# Patient Record
Sex: Female | Born: 1970
Health system: Southern US, Community
[De-identification: ages and names within clinical notes are randomized; demographics above are authoritative.]

## PROBLEM LIST (undated history)

## (undated) DIAGNOSIS — T7840XA Allergy, unspecified, initial encounter: Secondary | ICD-10-CM

## (undated) DIAGNOSIS — R32 Unspecified urinary incontinence: Secondary | ICD-10-CM

## (undated) DIAGNOSIS — G43909 Migraine, unspecified, not intractable, without status migrainosus: Secondary | ICD-10-CM

## (undated) DIAGNOSIS — F419 Anxiety disorder, unspecified: Secondary | ICD-10-CM

## (undated) DIAGNOSIS — F329 Major depressive disorder, single episode, unspecified: Secondary | ICD-10-CM

## (undated) DIAGNOSIS — J302 Other seasonal allergic rhinitis: Secondary | ICD-10-CM

## (undated) DIAGNOSIS — Z8619 Personal history of other infectious and parasitic diseases: Secondary | ICD-10-CM

## (undated) DIAGNOSIS — Z9109 Other allergy status, other than to drugs and biological substances: Secondary | ICD-10-CM

## (undated) DIAGNOSIS — K635 Polyp of colon: Secondary | ICD-10-CM

## (undated) DIAGNOSIS — F32A Depression, unspecified: Secondary | ICD-10-CM

## (undated) DIAGNOSIS — K219 Gastro-esophageal reflux disease without esophagitis: Secondary | ICD-10-CM

## (undated) DIAGNOSIS — F4310 Post-traumatic stress disorder, unspecified: Secondary | ICD-10-CM

## (undated) HISTORY — DX: Allergy, unspecified, initial encounter: T78.40XA

## (undated) HISTORY — DX: Depression, unspecified: F32.A

## (undated) HISTORY — PX: POLYPECTOMY: SHX149

## (undated) HISTORY — DX: Major depressive disorder, single episode, unspecified: F32.9

## (undated) HISTORY — PX: WISDOM TOOTH EXTRACTION: SHX21

## (undated) HISTORY — DX: Migraine, unspecified, not intractable, without status migrainosus: G43.909

## (undated) HISTORY — PX: TUBAL LIGATION: SHX77

## (undated) HISTORY — PX: COLONOSCOPY: SHX174

## (undated) HISTORY — DX: Other allergy status, other than to drugs and biological substances: Z91.09

## (undated) HISTORY — DX: Gastro-esophageal reflux disease without esophagitis: K21.9

## (undated) HISTORY — DX: Post-traumatic stress disorder, unspecified: F43.10

## (undated) HISTORY — PX: ESOPHAGOGASTRODUODENOSCOPY ENDOSCOPY: SHX5814

## (undated) HISTORY — DX: Personal history of other infectious and parasitic diseases: Z86.19

## (undated) HISTORY — PX: TONSILLECTOMY: SUR1361

## (undated) HISTORY — DX: Other seasonal allergic rhinitis: J30.2

## (undated) HISTORY — PX: ENDOMETRIAL ABLATION: SHX621

## (undated) HISTORY — DX: Unspecified urinary incontinence: R32

## (undated) HISTORY — DX: Anxiety disorder, unspecified: F41.9

## (undated) HISTORY — DX: Polyp of colon: K63.5

---

## 2006-01-05 HISTORY — PX: CHOLECYSTECTOMY: SHX55

## 2012-01-06 DIAGNOSIS — K635 Polyp of colon: Secondary | ICD-10-CM

## 2012-01-06 HISTORY — DX: Polyp of colon: K63.5

## 2015-08-20 ENCOUNTER — Encounter: Payer: Self-pay | Admitting: *Deleted

## 2015-08-20 ENCOUNTER — Telehealth: Payer: Self-pay | Admitting: *Deleted

## 2015-08-20 NOTE — Telephone Encounter (Signed)
Pre-Visit Call completed with patient and chart updated.   Pre-Visit Info documented in Specialty Comments under [employer].    

## 2015-08-20 NOTE — Telephone Encounter (Signed)
Unable to reach patient at time of Pre-Visit Call.  Left message for patient to return call when available.    

## 2015-08-21 ENCOUNTER — Encounter: Payer: Self-pay | Admitting: Physician Assistant

## 2015-08-21 ENCOUNTER — Ambulatory Visit (INDEPENDENT_AMBULATORY_CARE_PROVIDER_SITE_OTHER): Payer: BLUE CROSS/BLUE SHIELD | Admitting: Physician Assistant

## 2015-08-21 VITALS — BP 116/72 | HR 74 | Temp 99.0°F | Resp 16 | Ht 66.5 in | Wt 165.2 lb

## 2015-08-21 DIAGNOSIS — E559 Vitamin D deficiency, unspecified: Secondary | ICD-10-CM

## 2015-08-21 DIAGNOSIS — F411 Generalized anxiety disorder: Secondary | ICD-10-CM | POA: Diagnosis not present

## 2015-08-21 DIAGNOSIS — R221 Localized swelling, mass and lump, neck: Secondary | ICD-10-CM | POA: Diagnosis not present

## 2015-08-21 LAB — CBC WITH DIFFERENTIAL/PLATELET
BASOS ABS: 0 10*3/uL (ref 0.0–0.1)
Basophils Relative: 0.5 % (ref 0.0–3.0)
EOS PCT: 1.1 % (ref 0.0–5.0)
Eosinophils Absolute: 0.1 10*3/uL (ref 0.0–0.7)
HEMATOCRIT: 39.2 % (ref 36.0–46.0)
Hemoglobin: 13.3 g/dL (ref 12.0–15.0)
LYMPHS ABS: 2.8 10*3/uL (ref 0.7–4.0)
LYMPHS PCT: 37.5 % (ref 12.0–46.0)
MCHC: 34 g/dL (ref 30.0–36.0)
MCV: 87.2 fl (ref 78.0–100.0)
MONOS PCT: 9.7 % (ref 3.0–12.0)
Monocytes Absolute: 0.7 10*3/uL (ref 0.1–1.0)
NEUTROS ABS: 3.8 10*3/uL (ref 1.4–7.7)
NEUTROS PCT: 51.2 % (ref 43.0–77.0)
PLATELETS: 350 10*3/uL (ref 150.0–400.0)
RBC: 4.49 Mil/uL (ref 3.87–5.11)
RDW: 12.7 % (ref 11.5–15.5)
WBC: 7.5 10*3/uL (ref 4.0–10.5)

## 2015-08-21 MED ORDER — ALPRAZOLAM 0.5 MG PO TABS: 0.5000 mg | ORAL_TABLET | Freq: Every evening | ORAL | 1 refills | Status: DC | PRN

## 2015-08-21 NOTE — Patient Instructions (Signed)
Please go to the lab for blood work. I will call with your results. We will alter regimen based on results.  Please schedule an appointment for a complete physical.

## 2015-08-21 NOTE — Progress Notes (Signed)
Patient presents to clinic today to establish care.  Acute Concerns: Patient endorses a small lump of the left side of anterior neck over the pat couple of weeks. Notes lesion is mobile. Is not tender. Denies noting similar nodules elsewhere. Does note increase in allergy symptoms. Denies fever, chills, fatigue.   Chronic Issues: Patient with history of vitamin d deficiency, previously prescribed Ergocalciferol 50,000 units once weekly for several months. Is currently taking OTC supplement. Would like levels checked today.  Patient also with history of anxiety and depression coupled with PTSD. Sees a counselor every few weeks. Has been doing very well. Has Xanax Rx for rare use for panic attack or insomnia. Denies SI/HI.   Past Medical History:  Diagnosis Date  . Anxiety and depression    Hx of  . Colon polyps 2014   Benign  . Environmental allergies   . GERD (gastroesophageal reflux disease)   . History of chicken pox   . Migraines   . PTSD (post-traumatic stress disorder)   . Seasonal allergies   . Urine incontinence    Vaginal sling approx 12 yrs ago    Past Surgical History:  Procedure Laterality Date  . CHOLECYSTECTOMY  2008  . COLONOSCOPY    . ENDOMETRIAL ABLATION  ~2011   [facility]  . ESOPHAGOGASTRODUODENOSCOPY ENDOSCOPY    . TONSILLECTOMY  20 years ago  . TUBAL LIGATION    . WISDOM TOOTH EXTRACTION      Current Outpatient Prescriptions on File Prior to Visit  Medication Sig Dispense Refill  . b complex vitamins tablet Take 1 tablet by mouth as needed.    . Cholecalciferol (VITAMIN D PO) Take 5,000 Units by mouth 2 (two) times daily.     . cyclobenzaprine (FLEXERIL) 10 MG tablet Take 10 mg by mouth as needed for muscle spasms.    Marland Kitchen OVER THE COUNTER MEDICATION     . OVER THE COUNTER MEDICATION Digestive Aid    . Probiotic Product (PROBIOTIC DAILY PO) Take 1 capsule by mouth daily.     No current facility-administered medications on file prior to visit.      Allergies  Allergen Reactions  . Gluten Meal Nausea And Vomiting  . Kelp [Iodine] Rash  . Levofloxacin Rash  . Sulfamethoxazole-Trimethoprim Rash    Family History  Problem Relation Age of Onset  . Adopted: Yes  . Mental illness Mother     Manic Depression  . Alcoholism Father   . Stroke Sister     #1 80  . Clotting disorder Sister     #1  . Celiac disease Sister     74  . Raynaud syndrome Sister     #1  . Colitis Sister     Half  . Healthy Brother     Half  . Stroke Brother     Half  . Ovarian cancer Maternal Aunt   . Cervical cancer Maternal Aunt   . Learning disabilities Son     #1  . Autism Neg Hx     #2  . Anxiety disorder Neg Hx     #3    Social History   Social History  . Marital status: Divorced    Spouse name: N/A  . Number of children: N/A  . Years of education: N/A   Occupational History  . Not on file.   Social History Main Topics  . Smoking status: Never Smoker  . Smokeless tobacco: Never Used  . Alcohol use Yes  Comment: rare  . Drug use: No  . Sexual activity: Not on file   Other Topics Concern  . Not on file   Social History Narrative  . No narrative on file   Review of Systems  Constitutional: Negative for fever and weight loss.  HENT: Negative for ear discharge, ear pain, hearing loss and tinnitus.   Eyes: Negative for blurred vision, double vision, photophobia and pain.  Respiratory: Negative for cough and shortness of breath.   Cardiovascular: Negative for chest pain and palpitations.  Gastrointestinal: Negative for abdominal pain, blood in stool, constipation, diarrhea, heartburn, melena, nausea and vomiting.  Genitourinary: Negative for dysuria, flank pain, frequency, hematuria and urgency.  Musculoskeletal: Negative for falls.  Neurological: Negative for dizziness, loss of consciousness and headaches.  Endo/Heme/Allergies: Negative for environmental allergies.  Psychiatric/Behavioral: Positive for  depression. Negative for hallucinations, substance abuse and suicidal ideas. The patient is nervous/anxious. The patient does not have insomnia.     BP 116/72 (BP Location: Right Arm, Patient Position: Sitting, Cuff Size: Normal)   Pulse 74   Temp 99 F (37.2 C) (Oral)   Resp 16   Ht 5' 6.5" (1.689 m)   Wt 165 lb 4 oz (75 kg)   LMP 08/19/2015 Comment: perimenopausal  SpO2 98%   BMI 26.27 kg/m   Physical Exam  Constitutional: She is oriented to person, place, and time and well-developed, well-nourished, and in no distress.  HENT:  Head: Normocephalic and atraumatic.  Nose: Mucosal edema and rhinorrhea present. Right sinus exhibits no maxillary sinus tenderness and no frontal sinus tenderness. Left sinus exhibits no maxillary sinus tenderness and no frontal sinus tenderness.  Eyes: Conjunctivae are normal.  Neck: Neck supple.    Cardiovascular: Normal rate, regular rhythm, normal heart sounds and intact distal pulses.   Pulmonary/Chest: Effort normal and breath sounds normal. No respiratory distress. She has no wheezes. She has no rales. She exhibits no tenderness.  Neurological: She is alert and oriented to person, place, and time.  Skin: Skin is warm and dry. No rash noted.  Psychiatric: Affect normal.  Vitals reviewed.   Recent Results (from the past 2160 hour(s))  CBC w/Diff     Status: None   Collection Time: 08/21/15  2:38 PM  Result Value Ref Range   WBC 7.5 4.0 - 10.5 K/uL   RBC 4.49 3.87 - 5.11 Mil/uL   Hemoglobin 13.3 12.0 - 15.0 g/dL   HCT 39.2 36.0 - 46.0 %   MCV 87.2 78.0 - 100.0 fl   MCHC 34.0 30.0 - 36.0 g/dL   RDW 12.7 11.5 - 15.5 %   Platelets 350.0 150.0 - 400.0 K/uL   Neutrophils Relative % 51.2 43.0 - 77.0 %   Lymphocytes Relative 37.5 12.0 - 46.0 %   Monocytes Relative 9.7 3.0 - 12.0 %   Eosinophils Relative 1.1 0.0 - 5.0 %   Basophils Relative 0.5 0.0 - 3.0 %   Neutro Abs 3.8 1.4 - 7.7 K/uL   Lymphs Abs 2.8 0.7 - 4.0 K/uL   Monocytes Absolute 0.7  0.1 - 1.0 K/uL   Eosinophils Absolute 0.1 0.0 - 0.7 K/uL   Basophils Absolute 0.0 0.0 - 0.1 K/uL    Assessment/Plan: Hypovitaminosis D Will repeat Vitamin D level today.  Neck mass Consistent with palpable lymph node, suspect secondary to allergy. No sign of infection on examination. No other lymphadenopathy palpable. Will check CBC today. Discussed treatment for allergies. If not resolving over the next couple of weeks or if  CBC abnormal will obtain imaging.  Anxiety state Doing well overall. Continue counseling. Rx Xanax refilled.    Leeanne Rio, PA-C

## 2015-08-23 DIAGNOSIS — F411 Generalized anxiety disorder: Secondary | ICD-10-CM | POA: Insufficient documentation

## 2015-08-23 DIAGNOSIS — R221 Localized swelling, mass and lump, neck: Secondary | ICD-10-CM | POA: Insufficient documentation

## 2015-08-23 DIAGNOSIS — E559 Vitamin D deficiency, unspecified: Secondary | ICD-10-CM | POA: Insufficient documentation

## 2015-08-23 NOTE — Assessment & Plan Note (Signed)
Consistent with palpable lymph node, suspect secondary to allergy. No sign of infection on examination. No other lymphadenopathy palpable. Will check CBC today. Discussed treatment for allergies. If not resolving over the next couple of weeks or if CBC abnormal will obtain imaging.

## 2015-08-23 NOTE — Assessment & Plan Note (Signed)
Will repeat Vitamin D level today.

## 2015-08-23 NOTE — Assessment & Plan Note (Signed)
Doing well overall. Continue counseling. Rx Xanax refilled.

## 2015-08-25 LAB — VITAMIN D 1,25 DIHYDROXY
VITAMIN D 1, 25 (OH) TOTAL: 35 pg/mL (ref 18–72)
VITAMIN D3 1, 25 (OH): 35 pg/mL
Vitamin D2 1, 25 (OH)2: <8 pg/mL

## 2015-09-02 ENCOUNTER — Telehealth: Payer: Self-pay | Admitting: Physician Assistant

## 2015-09-02 MED ORDER — RIZATRIPTAN BENZOATE 10 MG PO TABS: 10.0000 mg | ORAL_TABLET | Freq: Once | ORAL | 0 refills | Status: DC

## 2015-09-02 NOTE — Telephone Encounter (Signed)
I have sent a refill to the patient's pharmacy.

## 2015-09-02 NOTE — Telephone Encounter (Signed)
Pt says that she use to take Maxalt. Pt would like to know if her provider would prescribe a Rx for medication as she needs it again. Pt says that she has a history of having migraines.    Please advise.  Pharmacy: [place], [place], [facility]

## 2015-09-04 ENCOUNTER — Encounter: Payer: Self-pay | Admitting: Physician Assistant

## 2015-09-04 ENCOUNTER — Ambulatory Visit (INDEPENDENT_AMBULATORY_CARE_PROVIDER_SITE_OTHER): Payer: BLUE CROSS/BLUE SHIELD | Admitting: Physician Assistant

## 2015-09-04 VITALS — BP 120/65 | HR 74 | Temp 98.0°F | Wt 166.0 lb

## 2015-09-04 DIAGNOSIS — Z0000 Encounter for general adult medical examination without abnormal findings: Secondary | ICD-10-CM | POA: Diagnosis not present

## 2015-09-04 NOTE — Assessment & Plan Note (Signed)
Depression screen negative. Health Maintenance reviewed -- Declines flu shot. Will go downstairs to schedule screening mammogram. PAP up-to-date. Followed by GYN. Preventive schedule discussed and handout given in AVS. Will obtain fasting labs today.

## 2015-09-04 NOTE — Addendum Note (Signed)
Addended by: Caffie Pinto on: 09/04/2015 05:08 PM   Modules accepted: Orders

## 2015-09-04 NOTE — Patient Instructions (Signed)
Please go to the lab for blood work.   Our office will call you with your results unless you have chosen to receive results via MyChart.  If your blood work is normal we will follow-up each year for physicals and as scheduled for chronic medical problems.  If anything is abnormal we will treat accordingly and get you in for a follow-up.  Continue medications as directed.  Preventive Care for Adults, Female A healthy lifestyle and preventive care can promote health and wellness. Preventive health guidelines for women include the following key practices.  A routine yearly physical is a good way to check with your health care provider about your health and preventive screening. It is a chance to share any concerns and updates on your health and to receive a thorough exam.  Visit your dentist for a routine exam and preventive care every 6 months. Brush your teeth twice a day and floss once a day. Good oral hygiene prevents tooth decay and gum disease.  The frequency of eye exams is based on your age, health, family medical history, use of contact lenses, and other factors. Follow your health care provider's recommendations for frequency of eye exams.  Eat a healthy diet. Foods like vegetables, fruits, whole grains, low-fat dairy products, and lean protein foods contain the nutrients you need without too many calories. Decrease your intake of foods high in solid fats, added sugars, and salt. Eat the right amount of calories for you.Get information about a proper diet from your health care provider, if necessary.  Regular physical exercise is one of the most important things you can do for your health. Most adults should get at least 150 minutes of moderate-intensity exercise (any activity that increases your heart rate and causes you to sweat) each week. In addition, most adults need muscle-strengthening exercises on 2 or more days a week.  Maintain a healthy weight. The body mass index (BMI) is a  screening tool to identify possible weight problems. It provides an estimate of body fat based on height and weight. Your health care provider can find your BMI and can help you achieve or maintain a healthy weight.For adults 20 years and older:  A BMI below 18.5 is considered underweight.  A BMI of 18.5 to 24.9 is normal.  A BMI of 25 to 29.9 is considered overweight.  A BMI of 30 and above is considered obese.  Maintain normal blood lipids and cholesterol levels by exercising and minimizing your intake of saturated fat. Eat a balanced diet with plenty of fruit and vegetables. Blood tests for lipids and cholesterol should begin at age 3 and be repeated every 5 years. If your lipid or cholesterol levels are high, you are over 50, or you are at high risk for heart disease, you may need your cholesterol levels checked more frequently.Ongoing high lipid and cholesterol levels should be treated with medicines if diet and exercise are not working.  If you smoke, find out from your health care provider how to quit. If you do not use tobacco, do not start.  Lung cancer screening is recommended for adults aged 24-80 years who are at high risk for developing lung cancer because of a history of smoking. A yearly low-dose CT scan of the lungs is recommended for people who have at least a 30-pack-year history of smoking and are a current smoker or have quit within the past 15 years. A pack year of smoking is smoking an average of 1 pack of cigarettes  a day for 1 year (for example: 1 pack a day for 30 years or 2 packs a day for 15 years). Yearly screening should continue until the smoker has stopped smoking for at least 15 years. Yearly screening should be stopped for people who develop a health problem that would prevent them from having lung cancer treatment.  If you are pregnant, do not drink alcohol. If you are breastfeeding, be very cautious about drinking alcohol. If you are not pregnant and choose to  drink alcohol, do not have more than 1 drink per day. One drink is considered to be 12 ounces (355 mL) of beer, 5 ounces (148 mL) of wine, or 1.5 ounces (44 mL) of liquor.  Avoid use of street drugs. Do not share needles with anyone. Ask for help if you need support or instructions about stopping the use of drugs.  High blood pressure causes heart disease and increases the risk of stroke. Your blood pressure should be checked at least every 1 to 2 years. Ongoing high blood pressure should be treated with medicines if weight loss and exercise do not work.  If you are 42-48 years old, ask your health care provider if you should take aspirin to prevent strokes.  Diabetes screening is done by taking a blood sample to check your blood glucose level after you have not eaten for a certain period of time (fasting). If you are not overweight and you do not have risk factors for diabetes, you should be screened once every 3 years starting at age 57. If you are overweight or obese and you are 27-72 years of age, you should be screened for diabetes every year as part of your cardiovascular risk assessment.  Breast cancer screening is essential preventive care for women. You should practice "breast self-awareness." This means understanding the normal appearance and feel of your breasts and may include breast self-examination. Any changes detected, no matter how small, should be reported to a health care provider. Women in their 68s and 30s should have a clinical breast exam (CBE) by a health care provider as part of a regular health exam every 1 to 3 years. After age 32, women should have a CBE every year. Starting at age 23, women should consider having a mammogram (breast X-ray test) every year. Women who have a family history of breast cancer should talk to their health care provider about genetic screening. Women at a high risk of breast cancer should talk to their health care providers about having an MRI and a  mammogram every year.  Breast cancer gene (BRCA)-related cancer risk assessment is recommended for women who have family members with BRCA-related cancers. BRCA-related cancers include breast, ovarian, tubal, and peritoneal cancers. Having family members with these cancers may be associated with an increased risk for harmful changes (mutations) in the breast cancer genes BRCA1 and BRCA2. Results of the assessment will determine the need for genetic counseling and BRCA1 and BRCA2 testing.  Your health care provider may recommend that you be screened regularly for cancer of the pelvic organs (ovaries, uterus, and vagina). This screening involves a pelvic examination, including checking for microscopic changes to the surface of your cervix (Pap test). You may be encouraged to have this screening done every 3 years, beginning at age 59.  For women ages 67-65, health care providers may recommend pelvic exams and Pap testing every 3 years, or they may recommend the Pap and pelvic exam, combined with testing for human papilloma virus (HPV),  every 5 years. Some types of HPV increase your risk of cervical cancer. Testing for HPV may also be done on women of any age with unclear Pap test results.  Other health care providers may not recommend any screening for nonpregnant women who are considered low risk for pelvic cancer and who do not have symptoms. Ask your health care provider if a screening pelvic exam is right for you.  If you have had past treatment for cervical cancer or a condition that could lead to cancer, you need Pap tests and screening for cancer for at least 20 years after your treatment. If Pap tests have been discontinued, your risk factors (such as having a new sexual partner) need to be reassessed to determine if screening should resume. Some women have medical problems that increase the chance of getting cervical cancer. In these cases, your health care provider may recommend more frequent  screening and Pap tests.  Colorectal cancer can be detected and often prevented. Most routine colorectal cancer screening begins at the age of 6 years and continues through age 24 years. However, your health care provider may recommend screening at an earlier age if you have risk factors for colon cancer. On a yearly basis, your health care provider may provide home test kits to check for hidden blood in the stool. Use of a small camera at the end of a tube, to directly examine the colon (sigmoidoscopy or colonoscopy), can detect the earliest forms of colorectal cancer. Talk to your health care provider about this at age 63, when routine screening begins. Direct exam of the colon should be repeated every 5-10 years through age 38 years, unless early forms of precancerous polyps or small growths are found.  People who are at an increased risk for hepatitis B should be screened for this virus. You are considered at high risk for hepatitis B if:  You were born in a country where hepatitis B occurs often. Talk with your health care provider about which countries are considered high risk.  Your parents were born in a high-risk country and you have not received a shot to protect against hepatitis B (hepatitis B vaccine).  You have HIV or AIDS.  You use needles to inject street drugs.  You live with, or have sex with, someone who has hepatitis B.  You get hemodialysis treatment.  You take certain medicines for conditions like cancer, organ transplantation, and autoimmune conditions.  Hepatitis C blood testing is recommended for all people born from [birth year] through 1965 and any individual with known risks for hepatitis C.  Practice safe sex. Use condoms and avoid high-risk sexual practices to reduce the spread of sexually transmitted infections (STIs). STIs include gonorrhea, chlamydia, syphilis, trichomonas, herpes, HPV, and human immunodeficiency virus (HIV). Herpes, HIV, and HPV are viral illnesses  that have no cure. They can result in disability, cancer, and death.  You should be screened for sexually transmitted illnesses (STIs) including gonorrhea and chlamydia if:  You are sexually active and are younger than 24 years.  You are older than 24 years and your health care provider tells you that you are at risk for this type of infection.  Your sexual activity has changed since you were last screened and you are at an increased risk for chlamydia or gonorrhea. Ask your health care provider if you are at risk.  If you are at risk of being infected with HIV, it is recommended that you take a prescription medicine daily to prevent  HIV infection. This is called preexposure prophylaxis (PrEP). You are considered at risk if:  You are sexually active and do not regularly use condoms or know the HIV status of your partner(s).  You take drugs by injection.  You are sexually active with a partner who has HIV.  Talk with your health care provider about whether you are at high risk of being infected with HIV. If you choose to begin PrEP, you should first be tested for HIV. You should then be tested every 3 months for as long as you are taking PrEP.  Osteoporosis is a disease in which the bones lose minerals and strength with aging. This can result in serious bone fractures or breaks. The risk of osteoporosis can be identified using a bone density scan. Women ages 38 years and over and women at risk for fractures or osteoporosis should discuss screening with their health care providers. Ask your health care provider whether you should take a calcium supplement or vitamin D to reduce the rate of osteoporosis.  Menopause can be associated with physical symptoms and risks. Hormone replacement therapy is available to decrease symptoms and risks. You should talk to your health care provider about whether hormone replacement therapy is right for you.  Use sunscreen. Apply sunscreen liberally and  repeatedly throughout the day. You should seek shade when your shadow is shorter than you. Protect yourself by wearing long sleeves, pants, a wide-brimmed hat, and sunglasses year round, whenever you are outdoors.  Once a month, do a whole body skin exam, using a mirror to look at the skin on your back. Tell your health care provider of new moles, moles that have irregular borders, moles that are larger than a pencil eraser, or moles that have changed in shape or color.  Stay current with required vaccines (immunizations).  Influenza vaccine. All adults should be immunized every year.  Tetanus, diphtheria, and acellular pertussis (Td, Tdap) vaccine. Pregnant women should receive 1 dose of Tdap vaccine during each pregnancy. The dose should be obtained regardless of the length of time since the last dose. Immunization is preferred during the 27th-36th week of gestation. An adult who has not previously received Tdap or who does not know her vaccine status should receive 1 dose of Tdap. This initial dose should be followed by tetanus and diphtheria toxoids (Td) booster doses every 10 years. Adults with an unknown or incomplete history of completing a 3-dose immunization series with Td-containing vaccines should begin or complete a primary immunization series including a Tdap dose. Adults should receive a Td booster every 10 years.  Varicella vaccine. An adult without evidence of immunity to varicella should receive 2 doses or a second dose if she has previously received 1 dose. Pregnant females who do not have evidence of immunity should receive the first dose after pregnancy. This first dose should be obtained before leaving the health care facility. The second dose should be obtained 4-8 weeks after the first dose.  Human papillomavirus (HPV) vaccine. Females aged 13-26 years who have not received the vaccine previously should obtain the 3-dose series. The vaccine is not recommended for use in pregnant  females. However, pregnancy testing is not needed before receiving a dose. If a female is found to be pregnant after receiving a dose, no treatment is needed. In that case, the remaining doses should be delayed until after the pregnancy. Immunization is recommended for any person with an immunocompromised condition through the age of 15 years if she did  not get any or all doses earlier. During the 3-dose series, the second dose should be obtained 4-8 weeks after the first dose. The third dose should be obtained 24 weeks after the first dose and 16 weeks after the second dose.  Zoster vaccine. One dose is recommended for adults aged 87 years or older unless certain conditions are present.  Measles, mumps, and rubella (MMR) vaccine. Adults born before [birth year] generally are considered immune to measles and mumps. Adults born in [birth year] or later should have 1 or more doses of MMR vaccine unless there is a contraindication to the vaccine or there is laboratory evidence of immunity to each of the three diseases. A routine second dose of MMR vaccine should be obtained at least 28 days after the first dose for students attending postsecondary schools, health care workers, or international travelers. People who received inactivated measles vaccine or an unknown type of measles vaccine during 1963-1967 should receive 2 doses of MMR vaccine. People who received inactivated mumps vaccine or an unknown type of mumps vaccine before 1979 and are at high risk for mumps infection should consider immunization with 2 doses of MMR vaccine. For females of childbearing age, rubella immunity should be determined. If there is no evidence of immunity, females who are not pregnant should be vaccinated. If there is no evidence of immunity, females who are pregnant should delay immunization until after pregnancy. Unvaccinated health care workers born before [birth year] who lack laboratory evidence of measles, mumps, or rubella immunity or laboratory  confirmation of disease should consider measles and mumps immunization with 2 doses of MMR vaccine or rubella immunization with 1 dose of MMR vaccine.  Pneumococcal 13-valent conjugate (PCV13) vaccine. When indicated, a person who is uncertain of his immunization history and has no record of immunization should receive the PCV13 vaccine. All adults 52 years of age and older should receive this vaccine. An adult aged 23 years or older who has certain medical conditions and has not been previously immunized should receive 1 dose of PCV13 vaccine. This PCV13 should be followed with a dose of pneumococcal polysaccharide (PPSV23) vaccine. Adults who are at high risk for pneumococcal disease should obtain the PPSV23 vaccine at least 8 weeks after the dose of PCV13 vaccine. Adults older than 45 years of age who have normal immune system function should obtain the PPSV23 vaccine dose at least 1 year after the dose of PCV13 vaccine.  Pneumococcal polysaccharide (PPSV23) vaccine. When PCV13 is also indicated, PCV13 should be obtained first. All adults aged 55 years and older should be immunized. An adult younger than age 54 years who has certain medical conditions should be immunized. Any person who resides in a nursing home or long-term care facility should be immunized. An adult smoker should be immunized. People with an immunocompromised condition and certain other conditions should receive both PCV13 and PPSV23 vaccines. People with human immunodeficiency virus (HIV) infection should be immunized as soon as possible after diagnosis. Immunization during chemotherapy or radiation therapy should be avoided. Routine use of PPSV23 vaccine is not recommended for American Indians, [place] Natives, or people younger than 65 years unless there are medical conditions that require PPSV23 vaccine. When indicated, people who have unknown immunization and have no record of immunization should receive PPSV23 vaccine. One-time  revaccination 5 years after the first dose of PPSV23 is recommended for people aged 19-64 years who have chronic kidney failure, nephrotic syndrome, asplenia, or immunocompromised conditions. People who received 1-2 doses of PPSV23 before  age 37 years should receive another dose of PPSV23 vaccine at age 55 years or later if at least 5 years have passed since the previous dose. Doses of PPSV23 are not needed for people immunized with PPSV23 at or after age 61 years.  Meningococcal vaccine. Adults with asplenia or persistent complement component deficiencies should receive 2 doses of quadrivalent meningococcal conjugate (MenACWY-D) vaccine. The doses should be obtained at least 2 months apart. Microbiologists working with certain meningococcal bacteria, [employer] recruits, people at risk during an outbreak, and people who travel to or live in countries with a high rate of meningitis should be immunized. A first-year college student up through age 40 years who is living in a residence hall should receive a dose if she did not receive a dose on or after her 16th birthday. Adults who have certain high-risk conditions should receive one or more doses of vaccine.  Hepatitis A vaccine. Adults who wish to be protected from this disease, have certain high-risk conditions, work with hepatitis A-infected animals, work in hepatitis A research labs, or travel to or work in countries with a high rate of hepatitis A should be immunized. Adults who were previously unvaccinated and who anticipate close contact with an international adoptee during the first 60 days after arrival in the [place] from a country with a high rate of hepatitis A should be immunized.  Hepatitis B vaccine. Adults who wish to be protected from this disease, have certain high-risk conditions, may be exposed to blood or other infectious body fluids, are household contacts or sex partners of hepatitis B positive people, are clients or workers in  certain care facilities, or travel to or work in countries with a high rate of hepatitis B should be immunized.  Haemophilus influenzae type b (Hib) vaccine. A previously unvaccinated person with asplenia or sickle cell disease or having a scheduled splenectomy should receive 1 dose of Hib vaccine. Regardless of previous immunization, a recipient of a hematopoietic stem cell transplant should receive a 3-dose series 6-12 months after her successful transplant. Hib vaccine is not recommended for adults with HIV infection. Preventive Services / Frequency Ages 2 to 69 years  Blood pressure check.** / Every 3-5 years.  Lipid and cholesterol check.** / Every 5 years beginning at age 73.  Clinical breast exam.** / Every 3 years for women in their 68s and 23s.  BRCA-related cancer risk assessment.** / For women who have family members with a BRCA-related cancer (breast, ovarian, tubal, or peritoneal cancers).  Pap test.** / Every 2 years from ages 38 through 62. Every 3 years starting at age 26 through age 43 or 58 with a history of 3 consecutive normal Pap tests.  HPV screening.** / Every 3 years from ages 13 through ages 75 to 72 with a history of 3 consecutive normal Pap tests.  Hepatitis C blood test.** / For any individual with known risks for hepatitis C.  Skin self-exam. / Monthly.  Influenza vaccine. / Every year.  Tetanus, diphtheria, and acellular pertussis (Tdap, Td) vaccine.** / Consult your health care provider. Pregnant women should receive 1 dose of Tdap vaccine during each pregnancy. 1 dose of Td every 10 years.  Varicella vaccine.** / Consult your health care provider. Pregnant females who do not have evidence of immunity should receive the first dose after pregnancy.  HPV vaccine. / 3 doses over 6 months, if 63 and younger. The vaccine is not recommended for use in pregnant females. However, pregnancy testing is not  needed before receiving a dose.  Measles, mumps, rubella  (MMR) vaccine.** / You need at least 1 dose of MMR if you were born in 1957 or later. You may also need a 2nd dose. For females of childbearing age, rubella immunity should be determined. If there is no evidence of immunity, females who are not pregnant should be vaccinated. If there is no evidence of immunity, females who are pregnant should delay immunization until after pregnancy.  Pneumococcal 13-valent conjugate (PCV13) vaccine.** / Consult your health care provider.  Pneumococcal polysaccharide (PPSV23) vaccine.** / 1 to 2 doses if you smoke cigarettes or if you have certain conditions.  Meningococcal vaccine.** / 1 dose if you are age 27 to 49 years and a [employer] researcher living in a residence hall, or have one of several medical conditions, you need to get vaccinated against meningococcal disease. You may also need additional booster doses.  Hepatitis A vaccine.** / Consult your health care provider.  Hepatitis B vaccine.** / Consult your health care provider.  Haemophilus influenzae type b (Hib) vaccine.** / Consult your health care provider. Ages 11 to 76 years  Blood pressure check.** / Every year.  Lipid and cholesterol check.** / Every 5 years beginning at age 53 years.  Lung cancer screening. / Every year if you are aged 26-80 years and have a 30-pack-year history of smoking and currently smoke or have quit within the past 15 years. Yearly screening is stopped once you have quit smoking for at least 15 years or develop a health problem that would prevent you from having lung cancer treatment.  Clinical breast exam.** / Every year after age 47 years.  BRCA-related cancer risk assessment.** / For women who have family members with a BRCA-related cancer (breast, ovarian, tubal, or peritoneal cancers).  Mammogram.** / Every year beginning at age 23 years and continuing for as long as you are in good health. Consult with your health care provider.  Pap test.** / Every 3  years starting at age 40 years through age 13 or 63 years with a history of 3 consecutive normal Pap tests.  HPV screening.** / Every 3 years from ages 65 years through ages 78 to 64 years with a history of 3 consecutive normal Pap tests.  Fecal occult blood test (FOBT) of stool. / Every year beginning at age 55 years and continuing until age 81 years. You may not need to do this test if you get a colonoscopy every 10 years.  Flexible sigmoidoscopy or colonoscopy.** / Every 5 years for a flexible sigmoidoscopy or every 10 years for a colonoscopy beginning at age 50 years and continuing until age 40 years.  Hepatitis C blood test.** / For all people born from [birth year] through 1965 and any individual with known risks for hepatitis C.  Skin self-exam. / Monthly.  Influenza vaccine. / Every year.  Tetanus, diphtheria, and acellular pertussis (Tdap/Td) vaccine.** / Consult your health care provider. Pregnant women should receive 1 dose of Tdap vaccine during each pregnancy. 1 dose of Td every 10 years.  Varicella vaccine.** / Consult your health care provider. Pregnant females who do not have evidence of immunity should receive the first dose after pregnancy.  Zoster vaccine.** / 1 dose for adults aged 76 years or older.  Measles, mumps, rubella (MMR) vaccine.** / You need at least 1 dose of MMR if you were born in 1957 or later. You may also need a second dose. For females of childbearing age, rubella immunity should  be determined. If there is no evidence of immunity, females who are not pregnant should be vaccinated. If there is no evidence of immunity, females who are pregnant should delay immunization until after pregnancy.  Pneumococcal 13-valent conjugate (PCV13) vaccine.** / Consult your health care provider.  Pneumococcal polysaccharide (PPSV23) vaccine.** / 1 to 2 doses if you smoke cigarettes or if you have certain conditions.  Meningococcal vaccine.** / Consult your health care  provider.  Hepatitis A vaccine.** / Consult your health care provider.  Hepatitis B vaccine.** / Consult your health care provider.  Haemophilus influenzae type b (Hib) vaccine.** / Consult your health care provider. Ages 22 years and over  Blood pressure check.** / Every year.  Lipid and cholesterol check.** / Every 5 years beginning at age 68 years.  Lung cancer screening. / Every year if you are aged 70-80 years and have a 30-pack-year history of smoking and currently smoke or have quit within the past 15 years. Yearly screening is stopped once you have quit smoking for at least 15 years or develop a health problem that would prevent you from having lung cancer treatment.  Clinical breast exam.** / Every year after age 58 years.  BRCA-related cancer risk assessment.** / For women who have family members with a BRCA-related cancer (breast, ovarian, tubal, or peritoneal cancers).  Mammogram.** / Every year beginning at age 59 years and continuing for as long as you are in good health. Consult with your health care provider.  Pap test.** / Every 3 years starting at age 47 years through age 32 or 26 years with 3 consecutive normal Pap tests. Testing can be stopped between 65 and 70 years with 3 consecutive normal Pap tests and no abnormal Pap or HPV tests in the past 10 years.  HPV screening.** / Every 3 years from ages 66 years through ages 17 or 80 years with a history of 3 consecutive normal Pap tests. Testing can be stopped between 65 and 70 years with 3 consecutive normal Pap tests and no abnormal Pap or HPV tests in the past 10 years.  Fecal occult blood test (FOBT) of stool. / Every year beginning at age 27 years and continuing until age 3 years. You may not need to do this test if you get a colonoscopy every 10 years.  Flexible sigmoidoscopy or colonoscopy.** / Every 5 years for a flexible sigmoidoscopy or every 10 years for a colonoscopy beginning at age 46 years and continuing  until age 63 years.  Hepatitis C blood test.** / For all people born from [birth year] through 1965 and any individual with known risks for hepatitis C.  Osteoporosis screening.** / A one-time screening for women ages 21 years and over and women at risk for fractures or osteoporosis.  Skin self-exam. / Monthly.  Influenza vaccine. / Every year.  Tetanus, diphtheria, and acellular pertussis (Tdap/Td) vaccine.** / 1 dose of Td every 10 years.  Varicella vaccine.** / Consult your health care provider.  Zoster vaccine.** / 1 dose for adults aged 52 years or older.  Pneumococcal 13-valent conjugate (PCV13) vaccine.** / Consult your health care provider.  Pneumococcal polysaccharide (PPSV23) vaccine.** / 1 dose for all adults aged 63 years and older.  Meningococcal vaccine.** / Consult your health care provider.  Hepatitis A vaccine.** / Consult your health care provider.  Hepatitis B vaccine.** / Consult your health care provider.  Haemophilus influenzae type b (Hib) vaccine.** / Consult your health care provider. ** Family history and personal history of risk  and conditions may change your health care provider's recommendations.   This information is not intended to replace advice given to you by your health care provider. Make sure you discuss any questions you have with your health care provider.   Document Released: 02/17/2001 Document Revised: 01/12/2014 Document Reviewed: 05/19/2010 Elsevier Interactive Patient Education [employer].

## 2015-09-04 NOTE — Progress Notes (Signed)
Patient presents to clinic today for annual exam.  Patient is fasting for labs. Body mass index is 26.39 kg/m. up-to-date. Is eating a well-balanced diet and trying to stay active.  Acute Concerns: Patient denies acute concerns today.   Health Maintenance: Immunizations -- Tetanus up-to-date. Declines flu shot. Mammogram -- Due for mammogram. Will schedule downstairs with Hoyle Sauer. PAP -- up-to-date.  Past Medical History:  Diagnosis Date  . Anxiety and depression    Hx of  . Colon polyps 2014   Benign  . Environmental allergies   . GERD (gastroesophageal reflux disease)   . History of chicken pox   . Migraines   . PTSD (post-traumatic stress disorder)   . Seasonal allergies   . Urine incontinence    Vaginal sling approx 12 yrs ago    Past Surgical History:  Procedure Laterality Date  . CHOLECYSTECTOMY  2008  . COLONOSCOPY    . ENDOMETRIAL ABLATION  ~2011   [facility]  . ESOPHAGOGASTRODUODENOSCOPY ENDOSCOPY    . TONSILLECTOMY  20 years ago  . TUBAL LIGATION    . WISDOM TOOTH EXTRACTION      Current Outpatient Prescriptions on File Prior to Visit  Medication Sig Dispense Refill  . ALPRAZolam (XANAX) 0.5 MG tablet Take 1 tablet (0.5 mg total) by mouth at bedtime as needed for anxiety. 30 tablet 1  . b complex vitamins tablet Take 1 tablet by mouth as needed.    . Bromelains (BROMELAIN PO) Take by mouth as needed.    . Cholecalciferol (VITAMIN D PO) Take 5,000 Units by mouth 2 (two) times daily.     . cyclobenzaprine (FLEXERIL) 10 MG tablet Take 10 mg by mouth as needed for muscle spasms.    . metoCLOPramide (REGLAN) 10 MG tablet Take 10 mg by mouth 3 (three) times daily before meals.    Marland Kitchen OVER THE COUNTER MEDICATION     . OVER THE COUNTER MEDICATION Digestive Aid    . Probiotic Product (PROBIOTIC DAILY PO) Take 1 capsule by mouth daily.    . rizatriptan (MAXALT) 10 MG tablet Take 1 tablet (10 mg total) by mouth once. May repeat in 2 hours if needed 10 tablet  0   No current facility-administered medications on file prior to visit.     Allergies  Allergen Reactions  . Gluten Meal Nausea And Vomiting  . Kelp [Iodine] Rash  . Levofloxacin Rash  . Sulfamethoxazole-Trimethoprim Rash    Family History  Problem Relation Age of Onset  . Adopted: Yes  . Mental illness Mother     Manic Depression  . Alcoholism Father   . Stroke Sister     #1 59  . Clotting disorder Sister     #1  . Celiac disease Sister     60  . Raynaud syndrome Sister     #1  . Colitis Sister     Half  . Healthy Brother     Half  . Stroke Brother     Half  . Ovarian cancer Maternal Aunt   . Cervical cancer Maternal Aunt   . Learning disabilities Son     #1  . Autism Neg Hx     #2  . Anxiety disorder Neg Hx     #3    Social History   Social History  . Marital status: Divorced    Spouse name: N/A  . Number of children: N/A  . Years of education: N/A   Occupational History  . Not  on file.   Social History Main Topics  . Smoking status: Never Smoker  . Smokeless tobacco: Never Used  . Alcohol use Yes     Comment: rare  . Drug use: No  . Sexual activity: Not on file   Other Topics Concern  . Not on file   Social History Narrative  . No narrative on file    Review of Systems  Constitutional: Negative for fever and weight loss.  HENT: Negative for ear discharge, ear pain, hearing loss and tinnitus.   Eyes: Negative for blurred vision, double vision, photophobia and pain.  Respiratory: Negative for cough and shortness of breath.   Cardiovascular: Negative for chest pain and palpitations.  Gastrointestinal: Negative for abdominal pain, blood in stool, constipation, diarrhea, heartburn, melena, nausea and vomiting.  Genitourinary: Negative for dysuria, flank pain, frequency, hematuria and urgency.  Musculoskeletal: Negative for falls.  Neurological: Negative for dizziness, loss of consciousness and headaches.  Endo/Heme/Allergies: Negative  for environmental allergies.  Psychiatric/Behavioral: Negative for depression, hallucinations, substance abuse and suicidal ideas. The patient is not nervous/anxious and does not have insomnia.     BP 120/65   Pulse 74   Temp 98 F (36.7 C)   Wt 166 lb (75.3 kg)   LMP 08/19/2015 Comment: perimenopausal  SpO2 99%   BMI 26.39 kg/m   Physical Exam  Constitutional: She is oriented to person, place, and time and well-developed, well-nourished, and in no distress.  HENT:  Head: Normocephalic and atraumatic.  Right Ear: Tympanic membrane, external ear and ear canal normal.  Left Ear: Tympanic membrane, external ear and ear canal normal.  Nose: Nose normal. No mucosal edema.  Mouth/Throat: Uvula is midline, oropharynx is clear and moist and mucous membranes are normal. No oropharyngeal exudate or posterior oropharyngeal erythema.  Eyes: Conjunctivae are normal. Pupils are equal, round, and reactive to light.  Neck: Neck supple. No thyromegaly present.  Cardiovascular: Normal rate, regular rhythm, normal heart sounds and intact distal pulses.   Pulmonary/Chest: Effort normal and breath sounds normal. No respiratory distress. She has no wheezes. She has no rales.  Abdominal: Soft. Bowel sounds are normal. She exhibits no distension and no mass. There is no tenderness. There is no rebound and no guarding.  Lymphadenopathy:    She has no cervical adenopathy.  Neurological: She is alert and oriented to person, place, and time. No cranial nerve deficit.  Skin: Skin is warm and dry. No rash noted.  Psychiatric: Affect normal.  Vitals reviewed.   Recent Results (from the past 2160 hour(s))  Vitamin D 1,25 dihydroxy     Status: None   Collection Time: 08/21/15  2:38 PM  Result Value Ref Range   Vitamin D 1, 25 (OH)2 Total 35 18 - 72 pg/mL   Vitamin D3 1, 25 (OH)2 35 pg/mL   Vitamin D2 1, 25 (OH)2 <8 pg/mL    Comment: Vitamin D3, 1,25(OH)2 indicates both endogenous production and  supplementation.  Vitamin D2, 1,25(OH)2 is an indicator of exogeous sources, such as diet or supplementation.  Interpretation and therapy are based on measurement of Vitamin D,1,25(OH)2, Total. This test was developed and its analytical performance characteristics have been determined by [facility], [place], [place]. It has not been cleared or approved by the FDA. This assay has been validated pursuant to the CLIA regulations and is used for clinical purposes.   CBC w/Diff     Status: None   Collection Time: 08/21/15  2:38 PM  Result Value Ref  Range   WBC 7.5 4.0 - 10.5 K/uL   RBC 4.49 3.87 - 5.11 Mil/uL   Hemoglobin 13.3 12.0 - 15.0 g/dL   HCT 39.2 36.0 - 46.0 %   MCV 87.2 78.0 - 100.0 fl   MCHC 34.0 30.0 - 36.0 g/dL   RDW 12.7 11.5 - 15.5 %   Platelets 350.0 150.0 - 400.0 K/uL   Neutrophils Relative % 51.2 43.0 - 77.0 %   Lymphocytes Relative 37.5 12.0 - 46.0 %   Monocytes Relative 9.7 3.0 - 12.0 %   Eosinophils Relative 1.1 0.0 - 5.0 %   Basophils Relative 0.5 0.0 - 3.0 %   Neutro Abs 3.8 1.4 - 7.7 K/uL   Lymphs Abs 2.8 0.7 - 4.0 K/uL   Monocytes Absolute 0.7 0.1 - 1.0 K/uL   Eosinophils Absolute 0.1 0.0 - 0.7 K/uL   Basophils Absolute 0.0 0.0 - 0.1 K/uL    Assessment/Plan: Visit for preventive health examination Depression screen negative. Health Maintenance reviewed -- Declines flu shot. Will go downstairs to schedule screening mammogram. PAP up-to-date. Followed by GYN. Preventive schedule discussed and handout given in AVS. Will obtain fasting labs today.     Leeanne Rio, PA-C

## 2015-09-05 LAB — COMPREHENSIVE METABOLIC PANEL
ALBUMIN: 4.1 g/dL (ref 3.5–5.2)
ALK PHOS: 63 U/L (ref 39–117)
ALT: 20 U/L (ref 0–35)
AST: 16 U/L (ref 0–37)
BILIRUBIN TOTAL: 0.6 mg/dL (ref 0.2–1.2)
BUN: 10 mg/dL (ref 6–23)
CO2: 29 mEq/L (ref 19–32)
Calcium: 8.8 mg/dL (ref 8.4–10.5)
Chloride: 101 mEq/L (ref 96–112)
Creatinine, Ser: 0.85 mg/dL (ref 0.40–1.20)
GFR: 76.97 mL/min (ref >60.00)
GLUCOSE: 82 mg/dL (ref 70–99)
Potassium: 4 mEq/L (ref 3.5–5.1)
Sodium: 135 mEq/L (ref 135–145)
TOTAL PROTEIN: 6.9 g/dL (ref 6.0–8.3)

## 2015-09-05 LAB — LIPID PANEL
CHOLESTEROL: 168 mg/dL (ref 0–200)
HDL: 55 mg/dL (ref >39.00)
LDL Cholesterol: 101 mg/dL — ABNORMAL HIGH (ref 0–99)
NONHDL: 113.28
Total CHOL/HDL Ratio: 3
Triglycerides: 63 mg/dL (ref 0.0–149.0)
VLDL: 12.6 mg/dL (ref 0.0–40.0)

## 2015-09-05 LAB — CBC
HCT: 38.8 % (ref 36.0–46.0)
HEMOGLOBIN: 13.3 g/dL (ref 12.0–15.0)
MCHC: 34.2 g/dL (ref 30.0–36.0)
MCV: 86.2 fl (ref 78.0–100.0)
PLATELETS: 322 10*3/uL (ref 150.0–400.0)
RBC: 4.5 Mil/uL (ref 3.87–5.11)
RDW: 12.6 % (ref 11.5–15.5)
WBC: 11.4 10*3/uL — AB (ref 4.0–10.5)

## 2015-09-05 LAB — HEMOGLOBIN A1C: HEMOGLOBIN A1C: 5.5 % (ref 4.6–6.5)

## 2015-09-05 LAB — TSH: TSH: 0.95 u[IU]/mL (ref 0.35–4.50)

## 2015-09-06 ENCOUNTER — Other Ambulatory Visit: Payer: Self-pay

## 2015-09-06 DIAGNOSIS — D72829 Elevated white blood cell count, unspecified: Secondary | ICD-10-CM

## 2015-09-18 ENCOUNTER — Other Ambulatory Visit (INDEPENDENT_AMBULATORY_CARE_PROVIDER_SITE_OTHER): Payer: BLUE CROSS/BLUE SHIELD

## 2015-09-18 ENCOUNTER — Other Ambulatory Visit: Payer: Self-pay | Admitting: Physician Assistant

## 2015-09-18 DIAGNOSIS — D72829 Elevated white blood cell count, unspecified: Secondary | ICD-10-CM

## 2015-09-18 DIAGNOSIS — Z0000 Encounter for general adult medical examination without abnormal findings: Secondary | ICD-10-CM

## 2015-09-18 DIAGNOSIS — Z1231 Encounter for screening mammogram for malignant neoplasm of breast: Secondary | ICD-10-CM

## 2015-09-18 LAB — CBC WITH DIFFERENTIAL/PLATELET
Basophils Absolute: 0 10*3/uL (ref 0.0–0.1)
Basophils Relative: 0.5 % (ref 0.0–3.0)
EOS PCT: 1.8 % (ref 0.0–5.0)
Eosinophils Absolute: 0.1 10*3/uL (ref 0.0–0.7)
HEMATOCRIT: 37.9 % (ref 36.0–46.0)
Hemoglobin: 12.8 g/dL (ref 12.0–15.0)
LYMPHS ABS: 3.1 10*3/uL (ref 0.7–4.0)
LYMPHS PCT: 37 % (ref 12.0–46.0)
MCHC: 33.7 g/dL (ref 30.0–36.0)
MCV: 86.8 fl (ref 78.0–100.0)
MONOS PCT: 9.6 % (ref 3.0–12.0)
Monocytes Absolute: 0.8 10*3/uL (ref 0.1–1.0)
NEUTROS PCT: 51.1 % (ref 43.0–77.0)
Neutro Abs: 4.3 10*3/uL (ref 1.4–7.7)
Platelets: 324 10*3/uL (ref 150.0–400.0)
RBC: 4.37 Mil/uL (ref 3.87–5.11)
RDW: 12.5 % (ref 11.5–15.5)
WBC: 8.4 10*3/uL (ref 4.0–10.5)

## 2015-09-19 LAB — URINALYSIS, ROUTINE W REFLEX MICROSCOPIC
Bilirubin Urine: NEGATIVE
Hgb urine dipstick: NEGATIVE
Ketones, ur: NEGATIVE
Nitrite: NEGATIVE
PH: 6 (ref 5.0–8.0)
RBC / HPF: NONE SEEN (ref 0–?)
Total Protein, Urine: NEGATIVE
Urine Glucose: NEGATIVE
Urobilinogen, UA: 0.2 (ref 0.0–1.0)

## 2015-09-23 ENCOUNTER — Encounter: Payer: Self-pay | Admitting: Physician Assistant

## 2015-09-26 ENCOUNTER — Other Ambulatory Visit: Payer: Self-pay | Admitting: Physician Assistant

## 2015-09-26 DIAGNOSIS — R8290 Unspecified abnormal findings in urine: Secondary | ICD-10-CM

## 2015-10-05 ENCOUNTER — Ambulatory Visit (HOSPITAL_BASED_OUTPATIENT_CLINIC_OR_DEPARTMENT_OTHER)
Admission: RE | Admit: 2015-10-05 | Discharge: 2015-10-05 | Disposition: A | Discharge status: Home / Self Care | Payer: BLUE CROSS/BLUE SHIELD | Source: Ambulatory Visit | Attending: Physician Assistant | Admitting: Physician Assistant

## 2015-10-05 DIAGNOSIS — R928 Other abnormal and inconclusive findings on diagnostic imaging of breast: Secondary | ICD-10-CM | POA: Diagnosis not present

## 2015-10-05 DIAGNOSIS — Z1231 Encounter for screening mammogram for malignant neoplasm of breast: Secondary | ICD-10-CM | POA: Diagnosis present

## 2015-10-10 ENCOUNTER — Other Ambulatory Visit: Payer: Self-pay | Admitting: *Deleted

## 2015-10-10 DIAGNOSIS — N6320 Unspecified lump in the left breast, unspecified quadrant: Secondary | ICD-10-CM

## 2015-10-11 ENCOUNTER — Encounter: Payer: Self-pay | Admitting: Physician Assistant

## 2015-10-13 NOTE — Telephone Encounter (Signed)
Will you please see Mychart message and inform [facility] that they should expect copies of patient's old imaging to compare to upcoming diagnostic mammogram.  Thank you

## 2015-10-16 ENCOUNTER — Ambulatory Visit
Admission: RE | Admit: 2015-10-16 | Discharge: 2015-10-16 | Disposition: A | Discharge status: Home / Self Care | Payer: BLUE CROSS/BLUE SHIELD | Source: Ambulatory Visit | Attending: Physician Assistant | Admitting: Physician Assistant

## 2015-10-16 DIAGNOSIS — N6320 Unspecified lump in the left breast, unspecified quadrant: Secondary | ICD-10-CM

## 2015-10-25 ENCOUNTER — Encounter: Payer: Self-pay | Admitting: Physician Assistant

## 2015-10-25 ENCOUNTER — Ambulatory Visit (INDEPENDENT_AMBULATORY_CARE_PROVIDER_SITE_OTHER): Payer: BLUE CROSS/BLUE SHIELD | Admitting: Physician Assistant

## 2015-10-25 VITALS — BP 127/71 | HR 78 | Temp 98.7°F | Resp 16 | Ht 66.5 in | Wt 166.4 lb

## 2015-10-25 DIAGNOSIS — R8290 Unspecified abnormal findings in urine: Secondary | ICD-10-CM | POA: Diagnosis not present

## 2015-10-25 DIAGNOSIS — Z23 Encounter for immunization: Secondary | ICD-10-CM

## 2015-10-25 DIAGNOSIS — R928 Other abnormal and inconclusive findings on diagnostic imaging of breast: Secondary | ICD-10-CM | POA: Diagnosis not present

## 2015-10-25 DIAGNOSIS — R238 Other skin changes: Secondary | ICD-10-CM

## 2015-10-25 DIAGNOSIS — R233 Spontaneous ecchymoses: Secondary | ICD-10-CM

## 2015-10-25 LAB — PROTIME-INR
INR: 1
Prothrombin Time: 10.2 s (ref 9.0–11.5)

## 2015-10-25 LAB — APTT: APTT: 25 s (ref 22–34)

## 2015-10-25 NOTE — Patient Instructions (Signed)
Please go to the lab for blood work and repeat urine testing. I will call with your results.  We will plan on repeat imaging in 6 months.

## 2015-10-25 NOTE — Progress Notes (Signed)
Patient presents to clinic today for follow-up regarding recent mammogram and breast US results. Patient had an abnormal area about 1.9 cm in diameter of the L breast noted on screening mammogram. Diagnostic mammogram and US obtained revealing area of concern as fibroglandular tissue. Prior imaging reviewed which revealed the same area without major change.  Patient also c/o easy bruising noted over the past several years. Endorses significant bruising from the slightest of things. Denies easy bleeding, gingival bleeding. Patient is not currently on blood thinners. Denies personal or family history of clotting/platelet disorder.  Recent physical labs obtained without abnormal findings. Will review again with patient.   Past Medical History:  Diagnosis Date  . Anxiety and depression    Hx of  . Colon polyps 2014   Benign  . Environmental allergies   . GERD (gastroesophageal reflux disease)   . History of chicken pox   . Migraines   . PTSD (post-traumatic stress disorder)   . Seasonal allergies   . Urine incontinence    Vaginal sling approx 12 yrs ago    Current Outpatient Prescriptions on File Prior to Visit  Medication Sig Dispense Refill  . ALPRAZolam (XANAX) 0.5 MG tablet Take 1 tablet (0.5 mg total) by mouth at bedtime as needed for anxiety. 30 tablet 1  . b complex vitamins tablet Take 1 tablet by mouth as needed.    . Bromelains (BROMELAIN PO) Take by mouth as needed.    . Cholecalciferol (VITAMIN D PO) Take 5,000 Units by mouth 2 (two) times daily.     . cyclobenzaprine (FLEXERIL) 10 MG tablet Take 10 mg by mouth as needed for muscle spasms.    . metoCLOPramide (REGLAN) 10 MG tablet Take 10 mg by mouth 3 (three) times daily as needed.     Marland Kitchen OVER THE COUNTER MEDICATION     . OVER THE COUNTER MEDICATION Digestive Aid    . Probiotic Product (PROBIOTIC DAILY PO) Take 1 capsule by mouth daily.    . rizatriptan (MAXALT) 10 MG tablet Take 1 tablet (10 mg total) by mouth once. May  repeat in 2 hours if needed 10 tablet 0   No current facility-administered medications on file prior to visit.     Allergies  Allergen Reactions  . Gluten Meal Nausea And Vomiting  . Kelp [Iodine] Rash  . Levofloxacin Rash  . Sulfamethoxazole-Trimethoprim Rash    Family History  Problem Relation Age of Onset  . Adopted: Yes  . Mental illness Mother     Manic Depression  . Alcoholism Father   . Stroke Sister     #1 25  . Clotting disorder Sister     #1  . Celiac disease Sister     53  . Raynaud syndrome Sister     #1  . Colitis Sister     Half  . Healthy Brother     Half  . Stroke Brother     Half  . Ovarian cancer Maternal Aunt   . Cervical cancer Maternal Aunt   . Learning disabilities Son     #1  . Autism Neg Hx     #2  . Anxiety disorder Neg Hx     #3    Social History   Social History  . Marital status: Divorced    Spouse name: N/A  . Number of children: N/A  . Years of education: N/A   Social History Main Topics  . Smoking status: Never Smoker  . Smokeless tobacco:  Never Used  . Alcohol use Yes     Comment: rare  . Drug use: No  . Sexual activity: Not Asked   Other Topics Concern  . None   Social History Narrative  . None    Review of Systems - See HPI.  All other ROS are negative.  BP 127/71 (BP Location: Left Arm, Patient Position: Sitting, Cuff Size: Large)   Pulse 78   Temp 98.7 F (37.1 C) (Oral)   Resp 16   Ht 5' 6.5" (1.689 m)   Wt 166 lb 6 oz (75.5 kg)   LMP 10/13/2015   SpO2 98%   BMI 26.45 kg/m   Physical Exam  Constitutional: She is well-developed, well-nourished, and in no distress.  HENT:  Head: Normocephalic and atraumatic.  Eyes: Conjunctivae are normal.  Cardiovascular: Normal rate, regular rhythm, normal heart sounds and intact distal pulses.   Pulmonary/Chest: Effort normal and breath sounds normal. No respiratory distress. She has no wheezes. She has no rales. She exhibits no tenderness.  Vitals  reviewed.   Recent Results (from the past 2160 hour(s))  Vitamin D 1,25 dihydroxy     Status: None   Collection Time: 08/21/15  2:38 PM  Result Value Ref Range   Vitamin D 1, 25 (OH)2 Total 35 18 - 72 pg/mL   Vitamin D3 1, 25 (OH)2 35 pg/mL   Vitamin D2 1, 25 (OH)2 <8 pg/mL    Comment: Vitamin D3, 1,25(OH)2 indicates both endogenous production and supplementation.  Vitamin D2, 1,25(OH)2 is an indicator of exogeous sources, such as diet or supplementation.  Interpretation and therapy are based on measurement of Vitamin D,1,25(OH)2, Total. This test was developed and its analytical performance characteristics have been determined by [facility], [place], [place]. It has not been cleared or approved by the FDA. This assay has been validated pursuant to the CLIA regulations and is used for clinical purposes.   CBC w/Diff     Status: None   Collection Time: 08/21/15  2:38 PM  Result Value Ref Range   WBC 7.5 4.0 - 10.5 K/uL   RBC 4.49 3.87 - 5.11 Mil/uL   Hemoglobin 13.3 12.0 - 15.0 g/dL   HCT 39.2 36.0 - 46.0 %   MCV 87.2 78.0 - 100.0 fl   MCHC 34.0 30.0 - 36.0 g/dL   RDW 12.7 11.5 - 15.5 %   Platelets 350.0 150.0 - 400.0 K/uL   Neutrophils Relative % 51.2 43.0 - 77.0 %   Lymphocytes Relative 37.5 12.0 - 46.0 %   Monocytes Relative 9.7 3.0 - 12.0 %   Eosinophils Relative 1.1 0.0 - 5.0 %   Basophils Relative 0.5 0.0 - 3.0 %   Neutro Abs 3.8 1.4 - 7.7 K/uL   Lymphs Abs 2.8 0.7 - 4.0 K/uL   Monocytes Absolute 0.7 0.1 - 1.0 K/uL   Eosinophils Absolute 0.1 0.0 - 0.7 K/uL   Basophils Absolute 0.0 0.0 - 0.1 K/uL  CBC     Status: Abnormal   Collection Time: 09/04/15  4:55 PM  Result Value Ref Range   WBC 11.4 (H) 4.0 - 10.5 K/uL   RBC 4.50 3.87 - 5.11 Mil/uL   Platelets 322.0 150.0 - 400.0 K/uL   Hemoglobin 13.3 12.0 - 15.0 g/dL   HCT 38.8 36.0 - 46.0 %   MCV 86.2 78.0 - 100.0 fl   MCHC 34.2 30.0 - 36.0 g/dL   RDW 12.6 11.5 - 15.5 %  Comprehensive  metabolic panel  Status: None   Collection Time: 09/04/15  4:55 PM  Result Value Ref Range   Sodium 135 135 - 145 mEq/L   Potassium 4.0 3.5 - 5.1 mEq/L   Chloride 101 96 - 112 mEq/L   CO2 29 19 - 32 mEq/L   Glucose, Bld 82 70 - 99 mg/dL   BUN 10 6 - 23 mg/dL   Creatinine, Ser 0.85 0.40 - 1.20 mg/dL   Total Bilirubin 0.6 0.2 - 1.2 mg/dL   Alkaline Phosphatase 63 39 - 117 U/L   AST 16 0 - 37 U/L   ALT 20 0 - 35 U/L   Total Protein 6.9 6.0 - 8.3 g/dL   Albumin 4.1 3.5 - 5.2 g/dL   Calcium 8.8 8.4 - 10.5 mg/dL   GFR 76.97 >60.00 mL/min  Lipid panel     Status: Abnormal   Collection Time: 09/04/15  4:55 PM  Result Value Ref Range   Cholesterol 168 0 - 200 mg/dL    Comment: ATP III Classification       Desirable:  < 200 mg/dL               Borderline High:  200 - 239 mg/dL          High:  > = 240 mg/dL   Triglycerides 63.0 0.0 - 149.0 mg/dL    Comment: Normal:  <150 mg/dLBorderline High:  150 - 199 mg/dL   HDL 55.00 >39.00 mg/dL   VLDL 12.6 0.0 - 40.0 mg/dL   LDL Cholesterol 101 (H) 0 - 99 mg/dL   Total CHOL/HDL Ratio 3     Comment:                Men          Women1/2 Average Risk     3.4          3.3Average Risk          5.0          4.42X Average Risk          9.6          7.13X Average Risk          15.0          11.0                       NonHDL 113.28     Comment: NOTE:  Non-HDL goal should be 30 mg/dL higher than patient's LDL goal (i.e. LDL goal of < 70 mg/dL, would have non-HDL goal of < 100 mg/dL)  Hemoglobin A1c     Status: None   Collection Time: 09/04/15  4:55 PM  Result Value Ref Range   Hgb A1c MFr Bld 5.5 4.6 - 6.5 %    Comment: Glycemic Control Guidelines for People with Diabetes:Non Diabetic:  <6%Goal of Therapy: <7%Additional Action Suggested:  >8%   TSH     Status: None   Collection Time: 09/04/15  4:55 PM  Result Value Ref Range   TSH 0.95 0.35 - 4.50 uIU/mL  CBC w/Diff     Status: None   Collection Time: 09/18/15  2:09 PM  Result Value Ref Range   WBC  8.4 4.0 - 10.5 K/uL   RBC 4.37 3.87 - 5.11 Mil/uL   Hemoglobin 12.8 12.0 - 15.0 g/dL   HCT 37.9 36.0 - 46.0 %   MCV 86.8 78.0 - 100.0 fl   MCHC 33.7 30.0 - 36.0 g/dL  RDW 12.5 11.5 - 15.5 %   Platelets 324.0 150.0 - 400.0 K/uL   Neutrophils Relative % 51.1 43.0 - 77.0 %   Lymphocytes Relative 37.0 12.0 - 46.0 %   Monocytes Relative 9.6 3.0 - 12.0 %   Eosinophils Relative 1.8 0.0 - 5.0 %   Basophils Relative 0.5 0.0 - 3.0 %   Neutro Abs 4.3 1.4 - 7.7 K/uL   Lymphs Abs 3.1 0.7 - 4.0 K/uL   Monocytes Absolute 0.8 0.1 - 1.0 K/uL   Eosinophils Absolute 0.1 0.0 - 0.7 K/uL   Basophils Absolute 0.0 0.0 - 0.1 K/uL  Urinalysis, Routine w reflex microscopic (not at [facility])     Status: Abnormal   Collection Time: 09/18/15  2:37 PM  Result Value Ref Range   Color, Urine YELLOW Yellow;Lt. Yellow   APPearance Cloudy with abundant amorphous urates (A) Clear   Specific Gravity, Urine >=1.030 (A) 1.000 - 1.030   pH 6.0 5.0 - 8.0   Total Protein, Urine NEGATIVE Negative   Urine Glucose NEGATIVE Negative   Ketones, ur NEGATIVE Negative   Bilirubin Urine NEGATIVE Negative   Hgb urine dipstick NEGATIVE Negative   Urobilinogen, UA 0.2 0.0 - 1.0   Leukocytes, UA SMALL (A) Negative   Nitrite NEGATIVE Negative   WBC, UA 3-6/hpf (A) 0-2/hpf   RBC / HPF none seen 0-2/hpf   Squamous Epithelial / LPF Many(>10/hpf) (A) Rare(0-4/hpf)   Ca Oxalate Crys, UA Presence of (A) None    Assessment/Plan: 1. Easy bruising Examination unremarkable. Recent labs reviewed. Platelet count, WBC count, liver function and albumin all within normal limits. No evidence of petechia on examination. Will check PT/PTT today.  - APTT - INR/PT  2. Abnormal urine odor Recent abnormal UA. Otherwise asymptomatic. Will repeat today. - CULTURE, URINE COMPREHENSIVE - Urinalysis, Routine w reflex microscopic (not at [facility])  3. Encounter for immunization Flu shot given today. - Flu Vaccine QUAD 36+ mos IM  4. Abnormal  mammogram Diag MM and US reveal area of concern is a benign area of dense fibroglandular tissue. Will repeat in 6 months.   Leeanne Rio, PA-C

## 2015-10-25 NOTE — Addendum Note (Signed)
Addended by: Caffie Pinto on: 10/25/2015 04:58 PM   Modules accepted: Orders

## 2015-10-26 LAB — URINALYSIS, ROUTINE W REFLEX MICROSCOPIC
BILIRUBIN URINE: NEGATIVE
Glucose, UA: NEGATIVE
Hgb urine dipstick: NEGATIVE
Ketones, ur: NEGATIVE
Leukocytes, UA: NEGATIVE
NITRITE: NEGATIVE
PROTEIN: NEGATIVE
Specific Gravity, Urine: 1.027 (ref 1.001–1.035)
pH: 5.5 (ref 5.0–8.0)

## 2015-10-28 LAB — CULTURE, URINE COMPREHENSIVE

## 2015-10-29 MED ORDER — NITROFURANTOIN MONOHYD MACRO 100 MG PO CAPS: 100.0000 mg | ORAL_CAPSULE | Freq: Two times a day (BID) | ORAL | 0 refills | Status: DC

## 2015-10-29 NOTE — Addendum Note (Signed)
Addended by: Tasia Catchings on: 10/29/2015 03:46 PM   Modules accepted: Orders

## 2015-11-08 ENCOUNTER — Encounter: Payer: Self-pay | Admitting: Physician Assistant

## 2015-11-20 ENCOUNTER — Ambulatory Visit (INDEPENDENT_AMBULATORY_CARE_PROVIDER_SITE_OTHER): Payer: BLUE CROSS/BLUE SHIELD | Admitting: Family Medicine

## 2015-11-20 ENCOUNTER — Encounter: Payer: Self-pay | Admitting: Family Medicine

## 2015-11-20 VITALS — BP 114/72 | HR 74 | Temp 98.5°F | Ht 66.0 in | Wt 164.4 lb

## 2015-11-20 DIAGNOSIS — R197 Diarrhea, unspecified: Secondary | ICD-10-CM

## 2015-11-20 NOTE — Patient Instructions (Addendum)
Come back on Monday, 11/25/15, if you are still having loose stools/diarrhea.   Keep taking the probiotics.

## 2015-11-20 NOTE — Progress Notes (Signed)
Chief Complaint  Patient presents with  . Transitions Of Care    Subjective: Patient is a 45 y.o. female here for loose stools.  She has recently undergone 2 courses of abx, one for an URI, and one for a UTI. Her last dose of abx was yesterday and she started having frequent and loose stools today. She denies any pain, bleeding, or weight loss. She has been taking probiotics. She also feels that she has a yeast infection, but does not want to take anything oral. She is wondering if Monostat is OK.   ROS: GI: No pain, +loose stools, no bleeding  Family History  Problem Relation Age of Onset  . Adopted: Yes  . Mental illness Mother     Manic Depression  . Alcoholism Father   . Stroke Sister     #1 35  . Clotting disorder Sister     #1  . Celiac disease Sister     53  . Raynaud syndrome Sister     #1  . Colitis Sister     Half  . Healthy Brother     Half  . Stroke Brother     Half  . Ovarian cancer Maternal Aunt   . Cervical cancer Maternal Aunt   . Learning disabilities Son     #1  . Autism Neg Hx     #2  . Anxiety disorder Neg Hx     #3   Past Medical History:  Diagnosis Date  . Anxiety and depression    Hx of  . Colon polyps 2014   Benign  . Environmental allergies   . GERD (gastroesophageal reflux disease)   . History of chicken pox   . Migraines   . PTSD (post-traumatic stress disorder)   . Seasonal allergies   . Urine incontinence    Vaginal sling approx 12 yrs ago   Allergies  Allergen Reactions  . Gluten Meal Nausea And Vomiting  . Kelp [Iodine] Rash  . Levofloxacin Rash  . Sulfamethoxazole-Trimethoprim Rash    Current Outpatient Prescriptions:  .  ALPRAZolam (XANAX) 0.5 MG tablet, Take 1 tablet (0.5 mg total) by mouth at bedtime as needed for anxiety., Disp: 30 tablet, Rfl: 1 .  b complex vitamins tablet, Take 1 tablet by mouth as needed., Disp: , Rfl:  .  Bromelains (BROMELAIN PO), Take by mouth as needed., Disp: , Rfl:  .   Cholecalciferol (VITAMIN D PO), Take 5,000 Units by mouth 2 (two) times daily. , Disp: , Rfl:  .  cyclobenzaprine (FLEXERIL) 10 MG tablet, Take 10 mg by mouth as needed for muscle spasms., Disp: , Rfl:  .  metoCLOPramide (REGLAN) 10 MG tablet, Take 10 mg by mouth 3 (three) times daily as needed. , Disp: , Rfl:  .  nitrofurantoin, macrocrystal-monohydrate, (MACROBID) 100 MG capsule, Take 1 capsule (100 mg total) by mouth 2 (two) times daily., Disp: 10 capsule, Rfl: 0 .  omeprazole (PRILOSEC) 40 MG capsule, Take 1 capsule by mouth as needed., Disp: , Rfl:  .  OVER THE COUNTER MEDICATION, , Disp: , Rfl:  .  OVER THE COUNTER MEDICATION, Digestive Aid, Disp: , Rfl:  .  Probiotic Product (PROBIOTIC DAILY PO), Take 1 capsule by mouth daily., Disp: , Rfl:  .  promethazine (PHENERGAN) 12.5 MG tablet, Take 1 tablet by mouth as needed., Disp: , Rfl:  .  rizatriptan (MAXALT) 10 MG tablet, Take 1 tablet (10 mg total) by mouth once. May repeat in 2 hours if needed,  Disp: 10 tablet, Rfl: 0  Objective: BP 114/72 (BP Location: Left Arm, Patient Position: Sitting, Cuff Size: Small)   Pulse 74   Temp 98.5 F (36.9 C) (Oral)   Ht 5\' 6"  (1.676 m)   Wt 164 lb 6.4 oz (74.6 kg)   LMP 11/06/2015   SpO2 98%   BMI 26.53 kg/m  General: Awake, appears stated age HEENT: MMM, EOMi Heart: RRR, no murmurs Lungs: CTAB, no rales, wheezes or rhonchi. No accessory muscle use Abd: BS+, soft, minor TTP, ND, no masses or organomegaly Psych: Age appropriate judgment and insight, normal affect and mood  Assessment and Plan: Diarrhea, unspecified type  Continue taking probiotics. If she is still having loose stools by Monday, I would like her to return. Will reevaluate and make sure she doesn't have Cdiff or some other infectious diarrhea. OK to use Monostat. F/u prn otherwise. The patient voiced understanding and agreement to the plan.  [place], DO 11/20/15  4:18 PM

## 2015-11-20 NOTE — Progress Notes (Signed)
Pre visit review using our clinic review tool, if applicable. No additional management support is needed unless otherwise documented below in the visit note. 

## 2015-12-13 ENCOUNTER — Ambulatory Visit (INDEPENDENT_AMBULATORY_CARE_PROVIDER_SITE_OTHER): Payer: BLUE CROSS/BLUE SHIELD | Admitting: Family Medicine

## 2015-12-13 ENCOUNTER — Ambulatory Visit (HOSPITAL_BASED_OUTPATIENT_CLINIC_OR_DEPARTMENT_OTHER)
Admission: RE | Admit: 2015-12-13 | Discharge: 2015-12-13 | Disposition: A | Discharge status: Home / Self Care | Payer: BLUE CROSS/BLUE SHIELD | Source: Ambulatory Visit | Attending: Family Medicine | Admitting: Family Medicine

## 2015-12-13 VITALS — BP 122/76 | HR 101 | Temp 98.4°F | Wt 163.2 lb

## 2015-12-13 DIAGNOSIS — J029 Acute pharyngitis, unspecified: Secondary | ICD-10-CM

## 2015-12-13 DIAGNOSIS — R05 Cough: Secondary | ICD-10-CM | POA: Insufficient documentation

## 2015-12-13 DIAGNOSIS — R059 Cough, unspecified: Secondary | ICD-10-CM

## 2015-12-13 DIAGNOSIS — J020 Streptococcal pharyngitis: Secondary | ICD-10-CM | POA: Diagnosis not present

## 2015-12-13 LAB — POCT RAPID STREP A (OFFICE): RAPID STREP A SCREEN: POSITIVE — AB

## 2015-12-13 MED ORDER — FLUCONAZOLE 150 MG PO TABS: 150.0000 mg | ORAL_TABLET | ORAL | 0 refills | Status: DC

## 2015-12-13 MED ORDER — CEFDINIR 300 MG PO CAPS: 300.0000 mg | ORAL_CAPSULE | Freq: Two times a day (BID) | ORAL | 0 refills | Status: AC

## 2015-12-13 NOTE — Progress Notes (Signed)
Pre visit review using our clinic review tool, if applicable. No additional management support is needed unless otherwise documented below in the visit note. 

## 2015-12-13 NOTE — Patient Instructions (Signed)
Strep Throat Strep throat is a bacterial infection of the throat. Your health care provider may call the infection tonsillitis or pharyngitis, depending on whether there is swelling in the tonsils or at the back of the throat. Strep throat is most common during the cold months of the year in children who are 5-45 years of age, but it can happen during any season in people of any age. This infection is spread from person to person (contagious) through coughing, sneezing, or close contact. What are the causes? Strep throat is caused by the bacteria called Streptococcus pyogenes. What increases the risk? This condition is more likely to develop in:  People who spend time in crowded places where the infection can spread easily.  People who have close contact with someone who has strep throat.  What are the signs or symptoms? Symptoms of this condition include:  Fever or chills.  Redness, swelling, or pain in the tonsils or throat.  Pain or difficulty when swallowing.  White or yellow spots on the tonsils or throat.  Swollen, tender glands in the neck or under the jaw.  Red rash all over the body (rare).  How is this diagnosed? This condition is diagnosed by performing a rapid strep test or by taking a swab of your throat (throat culture test). Results from a rapid strep test are usually ready in a few minutes, but throat culture test results are available after one or two days. How is this treated? This condition is treated with antibiotic medicine. Follow these instructions at home: Medicines  Take over-the-counter and prescription medicines only as told by your health care provider.  Take your antibiotic as told by your health care provider. Do not stop taking the antibiotic even if you start to feel better.  Have family members who also have a sore throat or fever tested for strep throat. They may need antibiotics if they have the strep infection. Eating and drinking  Do not  share food, drinking cups, or personal items that could cause the infection to spread to other people.  If swallowing is difficult, try eating soft foods until your sore throat feels better.  Drink enough fluid to keep your urine clear or pale yellow. General instructions  Gargle with a salt-water mixture 3-4 times per day or as needed. To make a salt-water mixture, completely dissolve -1 tsp of salt in 1 cup of warm water.  Make sure that all household members wash their hands well.  Get plenty of rest.  Stay home from school or work until you have been taking antibiotics for 24 hours.  Keep all follow-up visits as told by your health care provider. This is important. Contact a health care provider if:  The glands in your neck continue to get bigger.  You develop a rash, cough, or earache.  You cough up a thick liquid that is green, yellow-brown, or bloody.  You have pain or discomfort that does not get better with medicine.  Your problems seem to be getting worse rather than better.  You have a fever. Get help right away if:  You have new symptoms, such as vomiting, severe headache, stiff or painful neck, chest pain, or shortness of breath.  You have severe throat pain, drooling, or changes in your voice.  You have swelling of the neck, or the skin on the neck becomes red and tender.  You have signs of dehydration, such as fatigue, dry mouth, and decreased urination.  You become increasingly sleepy, or   you cannot wake up completely.  Your joints become red or painful. This information is not intended to replace advice given to you by your health care provider. Make sure you discuss any questions you have with your health care provider. Document Released: 12/20/1999 Document Revised: 08/21/2015 Document Reviewed: 04/16/2014 Elsevier Interactive Patient Education  2017 Else[employer] Inc[employer]  

## 2015-12-21 ENCOUNTER — Encounter: Payer: Self-pay | Admitting: Family Medicine

## 2015-12-23 ENCOUNTER — Other Ambulatory Visit: Payer: Self-pay | Admitting: Physician Assistant

## 2015-12-23 MED ORDER — ACYCLOVIR 400 MG PO TABS: 400.0000 mg | ORAL_TABLET | Freq: Three times a day (TID) | ORAL | 0 refills | Status: DC

## 2016-01-07 DIAGNOSIS — B001 Herpesviral vesicular dermatitis: Secondary | ICD-10-CM | POA: Insufficient documentation

## 2016-01-07 DIAGNOSIS — J020 Streptococcal pharyngitis: Secondary | ICD-10-CM | POA: Insufficient documentation

## 2016-01-07 NOTE — Progress Notes (Signed)
Patient ID: Laura Dougherty, female   DOB: July 19, 1970, 46 y.o.   MRN: [id number]   Subjective:    Patient ID: Laura Dougherty, female    DOB: November 18, 1970, 46 y.o.   MRN: [id number]  Chief Complaint  Patient presents with  . Cough    x6 weeks. Still ongoing. PND, chills, sore throat. Primarily a non productive cough.    HPI Patient is in today for evaluation of several repiratory concerns. She has been struggling with a nonproductive cough for over a month now. More recently has noted chill, myalgias, malaise and sore throat. Does note some mild nasal congestion and PND. Denies CP/palp/SOB/HA/fevers/GI or GU c/o. Taking meds as prescribed  Past Medical History:  Diagnosis Date  . Anxiety and depression    Hx of  . Colon polyps 2014   Benign  . Environmental allergies   . GERD (gastroesophageal reflux disease)   . History of chicken pox   . Migraines   . PTSD (post-traumatic stress disorder)   . Seasonal allergies   . Urine incontinence    Vaginal sling approx 12 yrs ago    Past Surgical History:  Procedure Laterality Date  . CHOLECYSTECTOMY  2008  . COLONOSCOPY    . ENDOMETRIAL ABLATION  ~2011   [facility]  . ESOPHAGOGASTRODUODENOSCOPY ENDOSCOPY    . TONSILLECTOMY  20 years ago  . TUBAL LIGATION    . WISDOM TOOTH EXTRACTION      Family History  Problem Relation Age of Onset  . Adopted: Yes  . Mental illness Mother     Manic Depression  . Alcoholism Father   . Stroke Sister     #1 13  . Clotting disorder Sister     #1  . Celiac disease Sister     75  . Raynaud syndrome Sister     #1  . Colitis Sister     Half  . Healthy Brother     Half  . Stroke Brother     Half  . Ovarian cancer Maternal Aunt   . Cervical cancer Maternal Aunt   . Learning disabilities Son     #1  . Autism Neg Hx     #2  . Anxiety disorder Neg Hx     #3    Social History   Social History  . Marital status: Divorced    Spouse name: N/A  . Number of children: N/A  .  Years of education: N/A   Occupational History  . Not on file.   Social History Main Topics  . Smoking status: Never Smoker  . Smokeless tobacco: Never Used  . Alcohol use Yes     Comment: rare  . Drug use: No  . Sexual activity: Not on file   Other Topics Concern  . Not on file   Social History Narrative  . No narrative on file    Outpatient Medications Prior to Visit  Medication Sig Dispense Refill  . ALPRAZolam (XANAX) 0.5 MG tablet Take 1 tablet (0.5 mg total) by mouth at bedtime as needed for anxiety. 30 tablet 1  . b complex vitamins tablet Take 1 tablet by mouth as needed.    . Bromelains (BROMELAIN PO) Take by mouth as needed.    . Cholecalciferol (VITAMIN D PO) Take 5,000 Units by mouth 2 (two) times daily.     . cyclobenzaprine (FLEXERIL) 10 MG tablet Take 10 mg by mouth as needed for muscle spasms.    . metoCLOPramide (REGLAN) 10 MG  tablet Take 10 mg by mouth 3 (three) times daily as needed.     . nitrofurantoin, macrocrystal-monohydrate, (MACROBID) 100 MG capsule Take 1 capsule (100 mg total) by mouth 2 (two) times daily. 10 capsule 0  . omeprazole (PRILOSEC) 40 MG capsule Take 1 capsule by mouth as needed.    Marland Kitchen OVER THE COUNTER MEDICATION     . OVER THE COUNTER MEDICATION Digestive Aid    . Probiotic Product (PROBIOTIC DAILY PO) Take 1 capsule by mouth daily.    . promethazine (PHENERGAN) 12.5 MG tablet Take 1 tablet by mouth as needed.    . rizatriptan (MAXALT) 10 MG tablet Take 1 tablet (10 mg total) by mouth once. May repeat in 2 hours if needed 10 tablet 0   No facility-administered medications prior to visit.     Allergies  Allergen Reactions  . Gluten Meal Nausea And Vomiting  . Kelp [Iodine] Rash  . Levofloxacin Rash  . Sulfamethoxazole-Trimethoprim Rash    Review of Systems  Constitutional: Positive for malaise/fatigue. Negative for fever.  HENT: Positive for congestion and sore throat.   Eyes: Positive for photophobia. Negative for blurred  vision.  Respiratory: Positive for sputum production. Negative for shortness of breath.   Cardiovascular: Negative for chest pain, palpitations and leg swelling.  Gastrointestinal: Negative for abdominal pain, blood in stool and nausea.  Genitourinary: Negative for dysuria and frequency.  Musculoskeletal: Positive for myalgias. Negative for falls.  Skin: Negative for rash.  Neurological: Negative for dizziness, loss of consciousness and headaches.  Endo/Heme/Allergies: Negative for environmental allergies.  Psychiatric/Behavioral: Negative for depression. The patient is not nervous/anxious.        Objective:    Physical Exam  Constitutional: She is oriented to person, place, and time. She appears well-developed and well-nourished. No distress.  HENT:  Head: Normocephalic and atraumatic.  Nose: Nose normal.  Oropharynx erythematous  Eyes: Right eye exhibits no discharge. Left eye exhibits no discharge.  Neck: Normal range of motion. Neck supple.  Cardiovascular: Normal rate and regular rhythm.   No murmur heard. Pulmonary/Chest: Effort normal and breath sounds normal.  Abdominal: Soft. Bowel sounds are normal. There is no tenderness.  Musculoskeletal: She exhibits no edema.  Lymphadenopathy:    She has cervical adenopathy.  Neurological: She is alert and oriented to person, place, and time.  Skin: Skin is warm and dry.  Psychiatric: She has a normal mood and affect.  Nursing note and vitals reviewed.   BP 122/76 (BP Location: Right Arm, Patient Position: Sitting, Cuff Size: Normal)   Pulse (!) 101   Temp 98.4 F (36.9 C) (Oral)   Wt 163 lb 3.2 oz (74 kg)   LMP 11/06/2015   SpO2 97% Comment: RA  BMI 26.34 kg/m  Wt Readings from Last 3 Encounters:  12/13/15 163 lb 3.2 oz (74 kg)  11/20/15 164 lb 6.4 oz (74.6 kg)  10/25/15 166 lb 6 oz (75.5 kg)     Lab Results  Component Value Date   WBC 8.4 09/18/2015   HGB 12.8 09/18/2015   HCT 37.9 09/18/2015   PLT 324.0  09/18/2015   GLUCOSE 82 09/04/2015   CHOL 168 09/04/2015   TRIG 63.0 09/04/2015   HDL 55.00 09/04/2015   LDLCALC 101 (H) 09/04/2015   ALT 20 09/04/2015   AST 16 09/04/2015   NA 135 09/04/2015   K 4.0 09/04/2015   CL 101 09/04/2015   CREATININE 0.85 09/04/2015   BUN 10 09/04/2015   CO2 29 09/04/2015  TSH 0.95 09/04/2015   INR 1.0 10/25/2015   HGBA1C 5.5 09/04/2015    Lab Results  Component Value Date   TSH 0.95 09/04/2015   Lab Results  Component Value Date   WBC 8.4 09/18/2015   HGB 12.8 09/18/2015   HCT 37.9 09/18/2015   MCV 86.8 09/18/2015   PLT 324.0 09/18/2015   Lab Results  Component Value Date   NA 135 09/04/2015   K 4.0 09/04/2015   CO2 29 09/04/2015   GLUCOSE 82 09/04/2015   BUN 10 09/04/2015   CREATININE 0.85 09/04/2015   BILITOT 0.6 09/04/2015   ALKPHOS 63 09/04/2015   AST 16 09/04/2015   ALT 20 09/04/2015   PROT 6.9 09/04/2015   ALBUMIN 4.1 09/04/2015   CALCIUM 8.8 09/04/2015   GFR 76.97 09/04/2015   Lab Results  Component Value Date   CHOL 168 09/04/2015   Lab Results  Component Value Date   HDL 55.00 09/04/2015   Lab Results  Component Value Date   LDLCALC 101 (H) 09/04/2015   Lab Results  Component Value Date   TRIG 63.0 09/04/2015   Lab Results  Component Value Date   CHOLHDL 3 09/04/2015   Lab Results  Component Value Date   HGBA1C 5.5 09/04/2015       Assessment & Plan:   Problem List Items Addressed This Visit    Streptococcal sore throat    Encouraged increased rest and hydration, add probiotics, zinc such as Coldeze or Xicam. Treat fevers as needed. Amoxicillin      Relevant Medications   fluconazole (DIFLUCAN) 150 MG tablet   RESOLVED: Recurrent cold sores           Relevant Medications   fluconazole (DIFLUCAN) 150 MG tablet    Other Visit Diagnoses    Sore throat    -  Primary   Relevant Orders   POCT rapid strep A (Completed)   Cough       Relevant Orders   DG Chest 2 View (Completed)      I  am having Laura Dougherty start on cefdinir and fluconazole. I am also having her maintain her cyclobenzaprine, Cholecalciferol (VITAMIN D PO), b complex vitamins, OVER THE COUNTER MEDICATION, Probiotic Product (PROBIOTIC DAILY PO), OVER THE COUNTER MEDICATION, metoCLOPramide, Bromelains (BROMELAIN PO), ALPRAZolam, rizatriptan, nitrofurantoin (macrocrystal-monohydrate), omeprazole, and promethazine.  Meds ordered this encounter  Medications  . cefdinir (OMNICEF) 300 MG capsule    Sig: Take 1 capsule (300 mg total) by mouth 2 (two) times daily.    Dispense:  20 capsule    Refill:  0  . fluconazole (DIFLUCAN) 150 MG tablet    Sig: Take 1 tablet (150 mg total) by mouth once a week.    Dispense:  3 tablet    Refill:  0     Penni Homans, MD

## 2016-01-07 NOTE — Assessment & Plan Note (Signed)
Encouraged increased rest and hydration, add probiotics, zinc such as Coldeze or Xicam. Treat fevers as needed. Amoxicillin

## 2016-02-26 ENCOUNTER — Other Ambulatory Visit (HOSPITAL_COMMUNITY)
Admission: RE | Admit: 2016-02-26 | Discharge: 2016-02-26 | Disposition: A | Discharge status: Home / Self Care | Payer: BLUE CROSS/BLUE SHIELD | Source: Ambulatory Visit | Attending: Medical | Admitting: Medical

## 2016-02-26 ENCOUNTER — Encounter: Payer: Self-pay | Admitting: Medical

## 2016-02-26 ENCOUNTER — Ambulatory Visit (INDEPENDENT_AMBULATORY_CARE_PROVIDER_SITE_OTHER): Payer: BLUE CROSS/BLUE SHIELD | Admitting: Medical

## 2016-02-26 VITALS — BP 107/79 | HR 87 | Temp 98.5°F | Resp 16 | Ht 66.0 in | Wt 162.4 lb

## 2016-02-26 DIAGNOSIS — R300 Dysuria: Secondary | ICD-10-CM | POA: Insufficient documentation

## 2016-02-26 DIAGNOSIS — L298 Other pruritus: Secondary | ICD-10-CM | POA: Insufficient documentation

## 2016-02-26 DIAGNOSIS — N926 Irregular menstruation, unspecified: Secondary | ICD-10-CM

## 2016-02-26 DIAGNOSIS — N898 Other specified noninflammatory disorders of vagina: Secondary | ICD-10-CM

## 2016-02-26 DIAGNOSIS — M545 Low back pain, unspecified: Secondary | ICD-10-CM

## 2016-02-26 LAB — POC URINALSYSI DIPSTICK (AUTOMATED)
Bilirubin, UA: NEGATIVE
GLUCOSE UA: NEGATIVE
Ketones, UA: NEGATIVE
Leukocytes, UA: NEGATIVE
NITRITE UA: NEGATIVE
PH UA: 6
PROTEIN UA: NEGATIVE
RBC UA: NEGATIVE
UROBILINOGEN UA: 0.2

## 2016-02-26 LAB — POCT URINE PREGNANCY: PREG TEST UR: NEGATIVE

## 2016-02-26 MED ORDER — FLUCONAZOLE 150 MG PO TABS: 150.0000 mg | ORAL_TABLET | Freq: Once | ORAL | 0 refills | Status: AC

## 2016-02-26 NOTE — Addendum Note (Signed)
Addended by: Rockwell [place] on: 02/26/2016 04:48 PM   Modules accepted: Orders

## 2016-02-26 NOTE — Progress Notes (Signed)
Pre visit review using our clinic review tool, if applicable. No additional management support is needed unless otherwise documented below in the visit note/SLS  

## 2016-02-26 NOTE — Patient Instructions (Addendum)
Your urine looks clear today. We will do urine culture and bacterial studies. Pending these results will rx diflucan. Studies should start to result by Friday. Call us on Friday if as we approach the weekend your signs and symptoms worsen. We will let you know results of studies as they come in.  Follow up in 7 days or as needed  Also consider using different condom brand in event you have allergy.

## 2016-02-26 NOTE — Progress Notes (Signed)
Subjective:    Patient ID: Laura Dougherty, female    DOB: 1970/02/08, 46 y.o.   MRN: [id number]  HPI  Pt in with some possible yeast infection. But pt also mention maybe some mild irritation to new condom. Pt states on Monday she had severe brief acute short pain while she urinated. Then faint pain later on urination. By Monday late  afternoon pain resolved. Now since Tuesday she has only faint mild itch.  Pt last menstrual cycle was about one month ago. She states any day now should have menses.   Urine preg test negative in office.  Review of Systems  Constitutional: Negative for chills, fatigue and fever.  Respiratory: Negative for cough, chest tightness, shortness of breath and wheezing.   Cardiovascular: Negative for chest pain and palpitations.  Gastrointestinal: Negative for abdominal pain.  Genitourinary: Positive for dysuria. Negative for decreased urine volume, difficulty urinating, frequency, hematuria, pelvic pain, urgency and vaginal discharge.       Faint itchiness.  Musculoskeletal: Negative for back pain, myalgias, neck pain and neck stiffness.  Skin: Negative for rash.  Neurological: Negative for dizziness and headaches.  Psychiatric/Behavioral: Negative for behavioral problems, confusion and sleep disturbance. The patient is not nervous/anxious.     Past Medical History:  Diagnosis Date  . Anxiety and depression    Hx of  . Colon polyps 2014   Benign  . Environmental allergies   . GERD (gastroesophageal reflux disease)   . History of chicken pox   . Migraines   . PTSD (post-traumatic stress disorder)   . Seasonal allergies   . Urine incontinence    Vaginal sling approx 12 yrs ago     Social History   Social History  . Marital status: Divorced    Spouse name: N/A  . Number of children: N/A  . Years of education: N/A   Occupational History  . Not on file.   Social History Main Topics  . Smoking status: Never Smoker  . Smokeless tobacco:  Never Used  . Alcohol use Yes     Comment: rare  . Drug use: No  . Sexual activity: Not on file   Other Topics Concern  . Not on file   Social History Narrative  . No narrative on file    Past Surgical History:  Procedure Laterality Date  . CHOLECYSTECTOMY  2008  . COLONOSCOPY    . ENDOMETRIAL ABLATION  ~2011   [facility]  . ESOPHAGOGASTRODUODENOSCOPY ENDOSCOPY    . TONSILLECTOMY  20 years ago  . TUBAL LIGATION    . WISDOM TOOTH EXTRACTION      Family History  Problem Relation Age of Onset  . Adopted: Yes  . Mental illness Mother     Manic Depression  . Alcoholism Father   . Stroke Sister     #1 67  . Clotting disorder Sister     #1  . Celiac disease Sister     36  . Raynaud syndrome Sister     #1  . Colitis Sister     Half  . Healthy Brother     Half  . Stroke Brother     Half  . Ovarian cancer Maternal Aunt   . Cervical cancer Maternal Aunt   . Learning disabilities Son     #1  . Autism Neg Hx     #2  . Anxiety disorder Neg Hx     #3    Allergies  Allergen Reactions  . Gluten  Meal Nausea And Vomiting  . Kelp [Iodine] Rash  . Levofloxacin Rash  . Sulfamethoxazole-Trimethoprim Rash    Current Outpatient Prescriptions on File Prior to Visit  Medication Sig Dispense Refill  . acyclovir (ZOVIRAX) 400 MG tablet Take 1 tablet (400 mg total) by mouth 3 (three) times daily. Take for 5 days. 15 tablet 0  . ALPRAZolam (XANAX) 0.5 MG tablet Take 1 tablet (0.5 mg total) by mouth at bedtime as needed for anxiety. 30 tablet 1  . b complex vitamins tablet Take 1 tablet by mouth as needed.    . Bromelains (BROMELAIN PO) Take by mouth as needed.    . Cholecalciferol (VITAMIN D PO) Take 5,000 Units by mouth 2 (two) times daily.     . cyclobenzaprine (FLEXERIL) 10 MG tablet Take 10 mg by mouth as needed for muscle spasms.    . metoCLOPramide (REGLAN) 10 MG tablet Take 10 mg by mouth 3 (three) times daily as needed.     Marland Kitchen omeprazole (PRILOSEC) 40 MG  capsule Take 1 capsule by mouth as needed.    Marland Kitchen OVER THE COUNTER MEDICATION     . OVER THE COUNTER MEDICATION Digestive Aid    . Probiotic Product (PROBIOTIC DAILY PO) Take 1 capsule by mouth daily.    . promethazine (PHENERGAN) 12.5 MG tablet Take 1 tablet by mouth as needed.    . rizatriptan (MAXALT) 10 MG tablet Take 1 tablet (10 mg total) by mouth once. May repeat in 2 hours if needed 10 tablet 0  . fluconazole (DIFLUCAN) 150 MG tablet Take 1 tablet (150 mg total) by mouth once a week. (Patient not taking: Reported on 02/26/2016) 3 tablet 0   No current facility-administered medications on file prior to visit.     BP 107/79 (BP Location: Right Arm, Patient Position: Sitting, Cuff Size: Large)   Pulse 87   Temp 98.5 F (36.9 C) (Oral)   Resp 16   Ht 5\' 6"  (1.676 m)   Wt 162 lb 6 oz (73.7 kg)   LMP 01/29/2016   SpO2 98%   BMI 26.21 kg/m       Objective:   Physical Exam  General Appearance- Not in acute distress.  HEENT Eyes- Scleraeral/Conjuntiva-bilat- Not Yellow. Mouth & Throat- Normal.  Chest and Lung Exam Auscultation: Breath sounds:-Normal. Adventitious sounds:- No Adventitious sounds.  Cardiovascular Auscultation:Rythm - Regular. Heart Sounds -Normal heart sounds.  Abdomen Inspection:-Inspection Normal.  Palpation/Perucssion: Palpation and Percussion of the abdomen reveal- Non Tender, No Rebound tenderness, No rigidity(Guarding) and No Palpable abdominal masses.  Liver:-Normal.  Spleen:- Normal.   Back- no cva tenderness.      Assessment & Plan:  [phone].   Your urine looks clear today. We will do urine culture and bacterial studies. Pending these results will rx diflucan. Studies should start to result by Friday. Call us on Friday if as we approach the weekend your signs and symptoms worsen. We will let you know results of studies as they come in.  Follow up in 7 days or as needed  Also consider using different condom brand in event you have  allergy.   For any mild sciatica are pain recommend low dose nsaid otc.

## 2016-02-27 LAB — CULTURE, URINE COMPREHENSIVE

## 2016-02-28 LAB — URINE CYTOLOGY ANCILLARY ONLY: Trichomonas: NEGATIVE

## 2016-02-29 ENCOUNTER — Encounter: Payer: Self-pay | Admitting: Medical

## 2016-03-02 LAB — URINE CYTOLOGY ANCILLARY ONLY
BACTERIAL VAGINITIS: POSITIVE — AB
Candida vaginitis: NEGATIVE

## 2016-03-02 MED ORDER — METRONIDAZOLE 500 MG PO TABS: 500.0000 mg | ORAL_TABLET | Freq: Two times a day (BID) | ORAL | 0 refills | Status: DC

## 2016-03-02 NOTE — Telephone Encounter (Signed)
rx flagyl sent to pt pharmacy 

## 2016-03-23 ENCOUNTER — Encounter: Payer: Self-pay | Admitting: Physician Assistant

## 2016-03-23 ENCOUNTER — Telehealth: Payer: Self-pay | Admitting: Physician Assistant

## 2016-03-23 ENCOUNTER — Encounter: Payer: Self-pay | Admitting: Medical

## 2016-03-23 NOTE — Telephone Encounter (Signed)
Forwarding to you as the treating provider.

## 2016-03-23 NOTE — Telephone Encounter (Signed)
Patient scheduled for 04/10/16 with Dr. Larose Kells

## 2016-03-23 NOTE — Telephone Encounter (Signed)
Patient would like to establish with Dr. Larose Kells. Patient states her entire family sees Dr. Larose Kells and Dr. Larose Kells approved at patient son last week appointment, please advise.

## 2016-03-23 NOTE — Telephone Encounter (Signed)
PCP approved, okay to schedule at Pt's convenience.

## 2016-03-23 NOTE — Telephone Encounter (Signed)
Patient calling back regarding my chart message, please advise best # work [phone] please advise

## 2016-03-25 ENCOUNTER — Encounter: Payer: Self-pay | Admitting: Medical

## 2016-03-25 ENCOUNTER — Other Ambulatory Visit (HOSPITAL_COMMUNITY)
Admission: RE | Admit: 2016-03-25 | Discharge: 2016-03-25 | Disposition: A | Discharge status: Home / Self Care | Payer: BLUE CROSS/BLUE SHIELD | Source: Ambulatory Visit | Attending: Medical | Admitting: Medical

## 2016-03-25 ENCOUNTER — Ambulatory Visit (INDEPENDENT_AMBULATORY_CARE_PROVIDER_SITE_OTHER): Payer: BLUE CROSS/BLUE SHIELD | Admitting: Medical

## 2016-03-25 VITALS — BP 119/77 | HR 72 | Temp 98.1°F | Resp 16 | Ht 66.0 in | Wt 164.8 lb

## 2016-03-25 DIAGNOSIS — Z113 Encounter for screening for infections with a predominantly sexual mode of transmission: Secondary | ICD-10-CM | POA: Diagnosis not present

## 2016-03-25 DIAGNOSIS — N898 Other specified noninflammatory disorders of vagina: Secondary | ICD-10-CM

## 2016-03-25 DIAGNOSIS — Z87440 Personal history of urinary (tract) infections: Secondary | ICD-10-CM | POA: Diagnosis not present

## 2016-03-25 NOTE — Progress Notes (Signed)
Subjective:    Patient ID: Laura Dougherty, female    DOB: 1970-09-24, 46 y.o.   MRN: [id number]  HPI  Pt has some recurrent symptoms. She had some BV in the past recently. I gave her flagyl. She did gradually feel better. But then she thought she had clear watery dc on monday. Pt states also in past she can get yeast infection in over cleaned vaginal area. Also has concern as she put it in my chart message maybe re-infection bv from personal pleasure device she used after treatment.  Pt states checked again last week that she is not pregnant. Pt had endometrial ablation and tubes ligation history.  Pt states no menses for 6-8 weeks. Last time she missed cycle was when x-husband  took her court.    Review of Systems  Constitutional: Negative for chills, fatigue and fever.  HENT: Negative for congestion, drooling, ear pain, facial swelling, nosebleeds and postnasal drip.   Respiratory: Negative for cough, chest tightness, shortness of breath and wheezing.   Cardiovascular: Negative for chest pain and palpitations.  Gastrointestinal: Negative for abdominal pain.  Genitourinary: Positive for vaginal discharge. Negative for decreased urine volume, dysuria, flank pain, frequency, genital sores, hematuria, pelvic pain and vaginal pain.  Musculoskeletal: Negative for back pain and myalgias.  Skin: Negative for rash.  Neurological: Negative for dizziness, seizures, speech difficulty, weakness, light-headedness and headaches.  Hematological: Negative for adenopathy. Does not bruise/bleed easily.  Psychiatric/Behavioral: Negative for behavioral problems, confusion and self-injury. The patient is not nervous/anxious and is not hyperactive.     Past Medical History:  Diagnosis Date  . Anxiety and depression    Hx of  . Colon polyps 2014   Benign  . Environmental allergies   . GERD (gastroesophageal reflux disease)   . History of chicken pox   . Migraines   . PTSD (post-traumatic stress  disorder)   . Seasonal allergies   . Urine incontinence    Vaginal sling approx 12 yrs ago     Social History   Social History  . Marital status: Divorced    Spouse name: N/A  . Number of children: N/A  . Years of education: N/A   Occupational History  . Not on file.   Social History Main Topics  . Smoking status: Never Smoker  . Smokeless tobacco: Never Used  . Alcohol use Yes     Comment: rare  . Drug use: No  . Sexual activity: Not on file   Other Topics Concern  . Not on file   Social History Narrative  . No narrative on file    Past Surgical History:  Procedure Laterality Date  . CHOLECYSTECTOMY  2008  . COLONOSCOPY    . ENDOMETRIAL ABLATION  ~2011   [facility]  . ESOPHAGOGASTRODUODENOSCOPY ENDOSCOPY    . TONSILLECTOMY  20 years ago  . TUBAL LIGATION    . WISDOM TOOTH EXTRACTION      Family History  Problem Relation Age of Onset  . Adopted: Yes  . Mental illness Mother     Manic Depression  . Alcoholism Father   . Stroke Sister     #1 32  . Clotting disorder Sister     #1  . Celiac disease Sister     47  . Raynaud syndrome Sister     #1  . Colitis Sister     Half  . Healthy Brother     Half  . Stroke Brother     Half  .  Ovarian cancer Maternal Aunt   . Cervical cancer Maternal Aunt   . Learning disabilities Son     #1  . Autism Neg Hx     #2  . Anxiety disorder Neg Hx     #3    Allergies  Allergen Reactions  . Gluten Meal Nausea And Vomiting  . Kelp [Iodine] Rash  . Levofloxacin Rash  . Sulfamethoxazole-Trimethoprim Rash    Current Outpatient Prescriptions on File Prior to Visit  Medication Sig Dispense Refill  . acyclovir (ZOVIRAX) 400 MG tablet Take 1 tablet (400 mg total) by mouth 3 (three) times daily. Take for 5 days. 15 tablet 0  . ALPRAZolam (XANAX) 0.5 MG tablet Take 1 tablet (0.5 mg total) by mouth at bedtime as needed for anxiety. 30 tablet 1  . b complex vitamins tablet Take 1 tablet by mouth as needed.      . Bromelains (BROMELAIN PO) Take by mouth as needed.    . Cholecalciferol (VITAMIN D PO) Take 5,000 Units by mouth 2 (two) times daily.     . cyclobenzaprine (FLEXERIL) 10 MG tablet Take 10 mg by mouth as needed for muscle spasms.    . fluconazole (DIFLUCAN) 150 MG tablet Take 1 tablet (150 mg total) by mouth once a week. (Patient not taking: Reported on 02/26/2016) 3 tablet 0  . metoCLOPramide (REGLAN) 10 MG tablet Take 10 mg by mouth 3 (three) times daily as needed.     . metroNIDAZOLE (FLAGYL) 500 MG tablet Take 1 tablet (500 mg total) by mouth 2 (two) times daily. 14 tablet 0  . omeprazole (PRILOSEC) 40 MG capsule Take 1 capsule by mouth as needed.    Marland Kitchen OVER THE COUNTER MEDICATION     . OVER THE COUNTER MEDICATION Digestive Aid    . Probiotic Product (PROBIOTIC DAILY PO) Take 1 capsule by mouth daily.    . promethazine (PHENERGAN) 12.5 MG tablet Take 1 tablet by mouth as needed.    . rizatriptan (MAXALT) 10 MG tablet Take 1 tablet (10 mg total) by mouth once. May repeat in 2 hours if needed 10 tablet 0   No current facility-administered medications on file prior to visit.     BP 119/77 (BP Location: Right Arm, Cuff Size: Normal)   Pulse 72   Temp 98.1 F (36.7 C) (Oral)   Resp 16   Ht 5\' 6"  (1.676 m)   Wt 164 lb 12.8 oz (74.8 kg)   LMP 02/12/2016   SpO2 100%   BMI 26.60 kg/m       Objective:   Physical Exam  General Appearance- Not in acute distress.  HEENT Eyes- Scleraeral/Conjuntiva-bilat- Not Yellow. Mouth & Throat- Normal.  Chest and Lung Exam Auscultation: Breath sounds:-Normal. Adventitious sounds:- No Adventitious sounds.  Cardiovascular Auscultation:Rythm - Regular. Heart Sounds -Normal heart sounds.  Abdomen Inspection:-Inspection Normal.  Palpation/Perucssion: Palpation and Percussion of the abdomen reveal- Non Tender, No Rebound tenderness, No rigidity(Guarding) and No Palpable abdominal masses.  Liver:-Normal.  Spleen:- Normal.   Back- no cva  tenderness.          Assessment & Plan:  You may have recurrent bv. We will do ancillary studies. You want to wait on results before potentially restarting the flagyl. That is fine but it symptoms worsen let us know pending the results.  Also get urine culture as well.  Counseled on differential diagnosis and manner we will proceed with work up. Counseled on BV.  Follow up prn pending study results.  [place],  Percell Miller, PA-C

## 2016-03-25 NOTE — Patient Instructions (Addendum)
You may have recurrent bv. We will do ancillary studies. You want to wait on results before potentially restarting the flagyl. That is fine but it symptoms worsen let us know pending the results.  Also get urine culture as well.  Follow up prn pending study results.

## 2016-03-25 NOTE — Progress Notes (Signed)
Pre visit review using our clinic review tool, if applicable. No additional management support is needed unless otherwise documented below in the visit note. 

## 2016-03-26 LAB — URINE CULTURE: ORGANISM ID, BACTERIA: NO GROWTH

## 2016-03-27 LAB — URINE CYTOLOGY ANCILLARY ONLY
Chlamydia: NEGATIVE
NEISSERIA GONORRHEA: NEGATIVE
TRICH (WINDOWPATH): NEGATIVE

## 2016-03-31 ENCOUNTER — Encounter: Payer: Self-pay | Admitting: Medical

## 2016-03-31 LAB — URINE CYTOLOGY ANCILLARY ONLY
BACTERIAL VAGINITIS: POSITIVE — AB
Candida vaginitis: NEGATIVE

## 2016-04-01 ENCOUNTER — Telehealth: Payer: Self-pay | Admitting: Medical

## 2016-04-01 ENCOUNTER — Other Ambulatory Visit (HOSPITAL_COMMUNITY)
Admission: RE | Admit: 2016-04-01 | Discharge: 2016-04-01 | Disposition: A | Discharge status: Home / Self Care | Payer: BLUE CROSS/BLUE SHIELD | Source: Ambulatory Visit | Attending: Physician Assistant | Admitting: Physician Assistant

## 2016-04-01 ENCOUNTER — Encounter: Payer: Self-pay | Admitting: Physician Assistant

## 2016-04-01 ENCOUNTER — Telehealth: Payer: Self-pay | Admitting: Physician Assistant

## 2016-04-01 ENCOUNTER — Ambulatory Visit (INDEPENDENT_AMBULATORY_CARE_PROVIDER_SITE_OTHER): Payer: BLUE CROSS/BLUE SHIELD | Admitting: Physician Assistant

## 2016-04-01 VITALS — BP 110/72 | HR 68 | Temp 98.8°F | Resp 14 | Ht 66.0 in | Wt 165.0 lb

## 2016-04-01 DIAGNOSIS — N760 Acute vaginitis: Secondary | ICD-10-CM

## 2016-04-01 DIAGNOSIS — B9689 Other specified bacterial agents as the cause of diseases classified elsewhere: Secondary | ICD-10-CM

## 2016-04-01 LAB — POCT URINALYSIS DIPSTICK
Bilirubin, UA: NEGATIVE
Glucose, UA: NEGATIVE
Ketones, UA: NEGATIVE
NITRITE UA: NEGATIVE
PH UA: 5 (ref 5.0–8.0)
PROTEIN UA: NEGATIVE
RBC UA: NEGATIVE
SPEC GRAV UA: 1.015 (ref 1.030–1.035)
UROBILINOGEN UA: 0.2 (ref ?–2.0)

## 2016-04-01 MED ORDER — CLINDAMYCIN PHOSPHATE 2 % VA CREA: 1.0000 | TOPICAL_CREAM | Freq: Every day | VAGINAL | 0 refills | Status: DC

## 2016-04-01 MED ORDER — METRONIDAZOLE 500 MG PO TABS: 500.0000 mg | ORAL_TABLET | Freq: Two times a day (BID) | ORAL | 0 refills | Status: DC

## 2016-04-01 NOTE — Telephone Encounter (Signed)
Patient was seen by the PA 03/25/16 for vaginal burning and stated she didn't want to follow up with the PA due to symptoms reoccurring, no availability currently at the [place] location due to patient preference of time, patient requested to see [facility]. Patient scheduled for 1:30 today at the [place] location.

## 2016-04-01 NOTE — Patient Instructions (Signed)
Please keep hydrated. Start a daily probiotic as discussed. Try the vinegar wipes twice weekly as discussed.  Use the Cleocin gel as directed. Let us know if symptoms are not resolving!  Feel better!

## 2016-04-01 NOTE — Telephone Encounter (Signed)
rx flagyl sent to pt pharmacy 

## 2016-04-01 NOTE — Progress Notes (Signed)
Pre visit review using our clinic review tool, if applicable. No additional management support is needed unless otherwise documented below in the visit note. 

## 2016-04-01 NOTE — Progress Notes (Signed)
l  Patient presents to clinic today c/o continued vaginal discharge with foul odor and vaginal irritation. Patient was seen on 03/25/16 with similar complaints. Was given a diflucan for potential yeast while workup in progress. Urine culture was negative as was STI testing. Urine ancillary testing was positive for BV but no treatment was started per my EMR review. Patient states she has history of bacterial vaginosis most recently diagnosed and treated in 02/26/16 with Flagyl. Patient states she felt like the symptoms never completely resolved. Denies vaginal pain, dyspareunia. Has one sexual partner, female. Denies concern for STI. Did use a new type of condom before onset of symptoms that she thinks could be related.  Denies fever, chills, malaise/fatigue or urinary symptoms.   Past Medical History:  Diagnosis Date  . Anxiety and depression    Hx of  . Colon polyps 2014   Benign  . Environmental allergies   . GERD (gastroesophageal reflux disease)   . History of chicken pox   . Migraines   . PTSD (post-traumatic stress disorder)   . Seasonal allergies   . Urine incontinence    Vaginal sling approx 12 yrs ago    Current Outpatient Prescriptions on File Prior to Visit  Medication Sig Dispense Refill  . acyclovir (ZOVIRAX) 400 MG tablet Take 1 tablet (400 mg total) by mouth 3 (three) times daily. Take for 5 days. (Patient taking differently: Take 400 mg by mouth 3 (three) times daily. Take for 5 days. ) 15 tablet 0  . ALPRAZolam (XANAX) 0.5 MG tablet Take 1 tablet (0.5 mg total) by mouth at bedtime as needed for anxiety. 30 tablet 1  . b complex vitamins tablet Take 1 tablet by mouth as needed.    . Bromelains (BROMELAIN PO) Take by mouth as needed.    . Cholecalciferol (VITAMIN D PO) Take 5,000 Units by mouth 2 (two) times daily.     . cyclobenzaprine (FLEXERIL) 10 MG tablet Take 10 mg by mouth as needed for muscle spasms.    . metoCLOPramide (REGLAN) 10 MG tablet Take 10 mg by mouth 3  (three) times daily as needed.     Marland Kitchen OVER THE COUNTER MEDICATION     . OVER THE COUNTER MEDICATION Digestive Aid    . Probiotic Product (PROBIOTIC DAILY PO) Take 1 capsule by mouth daily.    . promethazine (PHENERGAN) 12.5 MG tablet Take 1 tablet by mouth as needed.    . rizatriptan (MAXALT) 10 MG tablet Take 1 tablet (10 mg total) by mouth once. May repeat in 2 hours if needed 10 tablet 0   No current facility-administered medications on file prior to visit.     Allergies  Allergen Reactions  . Gluten Meal Nausea And Vomiting  . Kelp [Iodine] Rash  . Levofloxacin Rash  . Sulfamethoxazole-Trimethoprim Rash    Family History  Problem Relation Age of Onset  . Adopted: Yes  . Mental illness Mother     Manic Depression  . Alcoholism Father   . Stroke Sister     #1 29  . Clotting disorder Sister     #1  . Celiac disease Sister     87  . Raynaud syndrome Sister     #1  . Colitis Sister     Half  . Healthy Brother     Half  . Stroke Brother     Half  . Ovarian cancer Maternal Aunt   . Cervical cancer Maternal Aunt   . Learning disabilities Son     #  1  . Autism Neg Hx     #2  . Anxiety disorder Neg Hx     #3    Social History   Social History  . Marital status: Divorced    Spouse name: N/A  . Number of children: N/A  . Years of education: N/A   Social History Main Topics  . Smoking status: Never Smoker  . Smokeless tobacco: Never Used  . Alcohol use Yes     Comment: rare  . Drug use: No  . Sexual activity: Not Asked   Other Topics Concern  . None   Social History Narrative  . None   Review of Systems - See HPI.  All other ROS are negative.  BP 110/72   Pulse 68   Temp 98.8 F (37.1 C) (Oral)   Resp 14   Ht 5\' 6"  (1.676 m)   Wt 165 lb (74.8 kg)   SpO2 100%   BMI 26.63 kg/m   Physical Exam  Constitutional: She is oriented to person, place, and time and well-developed, well-nourished, and in no distress.  HENT:  Head: Normocephalic and  atraumatic.  Eyes: Conjunctivae are normal.  Neck: Neck supple.  Cardiovascular: Normal rate, regular rhythm, normal heart sounds and intact distal pulses.   Pulmonary/Chest: Effort normal and breath sounds normal. No respiratory distress. She has no wheezes. She has no rales.  Genitourinary: Uterus normal, cervix normal, right adnexa normal, left adnexa normal and vulva normal. Vagina exhibits normal mucosa and no lesion. Thin  fishy  white and vaginal discharge found.  Lymphadenopathy:    She has no cervical adenopathy.  Neurological: She is alert and oriented to person, place, and time.  Skin: Skin is warm and dry. No rash noted.  Psychiatric: Affect normal.  Vitals reviewed.    Recent Results (from the past 2160 hour(s))  Urine cytology ancillary only     Status: None   Collection Time: 02/26/16 12:00 AM  Result Value Ref Range   Trichomonas Negative     Comment: Normal Reference Range - Negative  Urine cytology ancillary only     Status: Abnormal   Collection Time: 02/26/16 12:00 AM  Result Value Ref Range   Bacterial vaginitis **POSITIVE for Gardnerella vaginalis** (A)     Comment: Normal Reference Range - Negative   Candida vaginitis Negative for Candida Vaginitis Microorganisms     Comment: Normal Reference Range - Negative  POCT Urinalysis Dipstick (Automated)     Status: Normal   Collection Time: 02/26/16  3:22 PM  Result Value Ref Range   Color, UA yellow    Clarity, UA odorous    Glucose, UA neg    Bilirubin, UA neg    Ketones, UA neg    Spec Grav, UA >=1.030    Blood, UA negative    pH, UA 6.0    Protein, UA neg    Urobilinogen, UA 0.2    Nitrite, UA neg    Leukocytes, UA Negative Negative  POCT urine pregnancy     Status: Normal   Collection Time: 02/26/16  4:47 PM  Result Value Ref Range   Preg Test, Ur Negative Negative  CULTURE, URINE COMPREHENSIVE     Status: None   Collection Time: 02/26/16  4:55 PM  Result Value Ref Range   Colony Count  1,000-10,000 CFU/mL    Organism ID, Bacteria GROUP B STREP (S.AGALACTIAE) ISOLATED   Urine cytology ancillary only     Status: None   Collection Time: 03/25/16  12:00 AM  Result Value Ref Range   Chlamydia Negative     Comment: Normal Reference Range - Negative   Neisseria gonorrhea Negative     Comment: Normal Reference Range - Negative   Trichomonas Negative     Comment: Normal Reference Range - Negative  Urine cytology ancillary only     Status: Abnormal   Collection Time: 03/25/16 12:00 AM  Result Value Ref Range   Bacterial vaginitis **POSITIVE for Gardnerella vaginalis** (A)     Comment: Normal Reference Range - Negative   Candida vaginitis Negative for Candida Vaginitis Microorganisms     Comment: Normal Reference Range - Negative  Urine culture     Status: None   Collection Time: 03/25/16  4:35 PM  Result Value Ref Range   Organism ID, Bacteria NO GROWTH   Cervicovaginal ancillary only     Status: Abnormal   Collection Time: 04/01/16 12:00 AM  Result Value Ref Range   Wet Prep (BD Affirm) **POSITIVE for Gardnerella** (A)     Comment: Normal Reference Range - Negative  POCT Urinalysis Dipstick     Status: Abnormal   Collection Time: 04/01/16  3:30 PM  Result Value Ref Range   Color, UA yellow    Clarity, UA clear    Glucose, UA negative    Bilirubin, UA negative    Ketones, UA negative    Spec Grav, UA 1.015 1.030 - 1.035   Blood, UA negative    pH, UA 5.0 5.0 - 8.0   Protein, UA negative    Urobilinogen, UA 0.2 Negative - 2.0   Nitrite, UA negative    Leukocytes, UA Trace (A) Negative    Assessment/Plan: 1. Bacterial vaginosis Discussed prior diagnosis via wet prep. Patient requested repeat urine testing - dip negative. No indication for repeat culture. Vaginal exam performed -- overall normal with exception of odorous thing discharge. Did obtain wet prep to verify no additional infection present due to patient concern. Giving recent treatment with Flagyl and  recurrence of symptoms, will treat with Cleocin vaginal gel. Daily probiotic recommended. Vinegar wipes discussed. If continuing to recur, will need GYN input.   - POCT Urinalysis Dipstick - Cervicovaginal ancillary only   Leeanne Rio, PA-C

## 2016-04-01 NOTE — Telephone Encounter (Signed)
Attempted to reach patient regarding appointment. LMOVM for callback. Always happy to see her of course, but appointment may not be necessary as she tested positive for BV on tests performed at other office last week.  I do see that she had a visit last week for these symptoms. Looks like culture was negative as was STD testing. Seems she did test positive for bacterial vaginosis which she has a significant history of. No treatment started by the provider who saw patient as of this point. Patient states she is having worsening symptoms and would like to have repeat assessment today to make sure nothing else is going on. We will start treatment at that time. Likely worsened due to no treatment at this point but will verify nothing else is going on.

## 2016-04-02 LAB — CERVICOVAGINAL ANCILLARY ONLY: Wet Prep (BD Affirm): POSITIVE — AB

## 2016-04-02 NOTE — Telephone Encounter (Signed)
Patient informed but she stated that because she had not heard back from our office yet that she went ahead and seen Elyn Aquas, [place] yesterday and was px Clindamycin vaginal ABX, since she had adverse affects from Flagyl prior. Her PCP is doing F/U testing and she does have a GYN. She would like to change PCP to Dr. Larose Kells in future and wanted to know if she could still be seen by other providers, if needed, for Acute visits; she states she was told that she could not, informed her that she is always able to see other providers within out health system, if needed, being an established patient; Patient informed, understood/SLS 03/29

## 2016-04-02 NOTE — Telephone Encounter (Signed)
-----   Message from Mackie Pai, PA-C sent at 04/01/2016  8:59 PM EDT ----- BV again came back positive. Rx flagyl will be sent to the pharmacy. We had the discussion on cause. Normal bacterial flora vs thought of it as std. I want you to repeat urine ancillary study again day 8(one day after you finish the 7 day  Flagyl treatment). You expressed stress about recurrent infection so  I could also refer you to gynecologist if you wish. Who is your partners primary care provider. They could do a urine study to determine if he has bv.(this would precautionary measure as I explained I have only seen one female pt come up positive for bv in 19 years) Or I would be willing to see and order test. Sorry for the delay in getting lab. Usually test result takes about 4 days. 1-2 day delay because lab was not releasing result. As I understand computer problem with lab releasing the study to me.

## 2016-04-02 NOTE — Telephone Encounter (Signed)
Copy & Paste in to Open Result note.

## 2016-04-03 ENCOUNTER — Encounter: Payer: Self-pay | Admitting: Physician Assistant

## 2016-04-04 ENCOUNTER — Other Ambulatory Visit: Payer: Self-pay | Admitting: Physician Assistant

## 2016-04-06 ENCOUNTER — Encounter: Payer: Self-pay | Admitting: Physician Assistant

## 2016-04-06 ENCOUNTER — Telehealth: Payer: Self-pay | Admitting: Physician Assistant

## 2016-04-06 NOTE — Telephone Encounter (Signed)
Pt called [facility] office about this same issue stating this was for a February DOS. Pt declined Ebony's VM. Pt stated she needed to go because she was receiving call from [place].

## 2016-04-06 NOTE — Telephone Encounter (Signed)
Spoke with patient in regards to billing concerns. Patients insurance does not cover [facility] as in-network and she is questioning why specimins were sent there, explained our process for the testing she received. She is also questioning the need for the second STD testing since she was coming back for the same symptoms and the first test was negative. She had to be tested for BV a third time because she was never contacted and treated when she was seen 03/25/16.    Told patient I will have her orders reviewed and I will work with the Cytology department to make sure the billing was all correct and I will follow up with her when I know more

## 2016-04-06 NOTE — Telephone Encounter (Signed)
Patient called in upset her labs where billed as hospital outpatient. She now has several bills totaling $500 says she would have asked to go through [place].  Routed to Environmental education officer for assistance for past bills.

## 2016-04-07 MED ORDER — RIZATRIPTAN BENZOATE 10 MG PO TABS: 10.0000 mg | ORAL_TABLET | Freq: Once | ORAL | 0 refills | Status: DC

## 2016-04-10 ENCOUNTER — Ambulatory Visit: Payer: BLUE CROSS/BLUE SHIELD | Admitting: Internal Medicine

## 2016-05-22 ENCOUNTER — Encounter: Payer: Self-pay | Admitting: Physician Assistant

## 2016-05-29 ENCOUNTER — Ambulatory Visit: Payer: BLUE CROSS/BLUE SHIELD | Admitting: Physician Assistant

## 2016-06-03 ENCOUNTER — Telehealth: Payer: Self-pay | Admitting: Medical

## 2016-06-03 ENCOUNTER — Telehealth: Payer: Self-pay | Admitting: Physician Assistant

## 2016-06-03 NOTE — Telephone Encounter (Signed)
Pt called requesting phone number for billing. Gave pt ph [phone].

## 2016-06-05 ENCOUNTER — Other Ambulatory Visit (HOSPITAL_COMMUNITY)
Admission: RE | Admit: 2016-06-05 | Discharge: 2016-06-05 | Disposition: A | Discharge status: Home / Self Care | Payer: BLUE CROSS/BLUE SHIELD | Source: Ambulatory Visit | Attending: Physician Assistant | Admitting: Physician Assistant

## 2016-06-05 ENCOUNTER — Ambulatory Visit (INDEPENDENT_AMBULATORY_CARE_PROVIDER_SITE_OTHER): Payer: BLUE CROSS/BLUE SHIELD | Admitting: Physician Assistant

## 2016-06-05 ENCOUNTER — Encounter: Payer: Self-pay | Admitting: Gastroenterology

## 2016-06-05 ENCOUNTER — Encounter: Payer: Self-pay | Admitting: Physician Assistant

## 2016-06-05 VITALS — BP 120/84 | HR 70 | Temp 98.3°F | Resp 14 | Ht 66.0 in | Wt 161.0 lb

## 2016-06-05 DIAGNOSIS — R1310 Dysphagia, unspecified: Secondary | ICD-10-CM

## 2016-06-05 DIAGNOSIS — N898 Other specified noninflammatory disorders of vagina: Secondary | ICD-10-CM

## 2016-06-05 DIAGNOSIS — N926 Irregular menstruation, unspecified: Secondary | ICD-10-CM

## 2016-06-05 LAB — POCT URINALYSIS DIPSTICK
BILIRUBIN UA: NEGATIVE
Blood, UA: NEGATIVE
GLUCOSE UA: NEGATIVE
Ketones, UA: NEGATIVE
LEUKOCYTES UA: NEGATIVE
NITRITE UA: NEGATIVE
PH UA: 7 (ref 5.0–8.0)
Protein, UA: NEGATIVE
Spec Grav, UA: 1.015 (ref 1.010–1.025)
Urobilinogen, UA: 0.2 E.U./dL

## 2016-06-05 LAB — COMPREHENSIVE METABOLIC PANEL
ALK PHOS: 69 U/L (ref 39–117)
ALT: 23 U/L (ref 0–35)
AST: 18 U/L (ref 0–37)
Albumin: 4.5 g/dL (ref 3.5–5.2)
BUN: 15 mg/dL (ref 6–23)
CO2: 31 mEq/L (ref 19–32)
Calcium: 9.6 mg/dL (ref 8.4–10.5)
Chloride: 101 mEq/L (ref 96–112)
Creatinine, Ser: 0.81 mg/dL (ref 0.40–1.20)
GFR: 81.09 mL/min (ref >60.00)
GLUCOSE: 90 mg/dL (ref 70–99)
POTASSIUM: 4.3 meq/L (ref 3.5–5.1)
SODIUM: 137 meq/L (ref 135–145)
TOTAL PROTEIN: 7.2 g/dL (ref 6.0–8.3)
Total Bilirubin: 0.6 mg/dL (ref 0.2–1.2)

## 2016-06-05 LAB — FOLLICLE STIMULATING HORMONE: FSH: 30.8 m[IU]/mL

## 2016-06-05 LAB — LUTEINIZING HORMONE: LH: 34.98 m[IU]/mL

## 2016-06-05 NOTE — Progress Notes (Signed)
Pre visit review using our clinic review tool, if applicable. No additional management support is needed unless otherwise documented below in the visit note. 

## 2016-06-05 NOTE — Progress Notes (Signed)
Patient presents to clinic today c/o mild vaginal discharge at present along with sporadic menstrual cycles. Patient states she recently has noted several months between periods. Last menstrual period was a few weeks prior but lasted significantly longer than prior periods. Denies any menorrhagia. Has noted intermittent episodes of vaginal discharge, previously diagnosed with bacterial vaginosis which was treated appropriately. Other times having discharge without infection. Current discharge is thin and watery without odor. Patient denies vaginal itch, irritation. Is not currently sexually active. Denies dysuria, urinary urgency and frequency. Is wanting assessment today to make sure there is no infection present and labs to check hormone levels.  Past Medical History:  Diagnosis Date  . Anxiety and depression    Hx of  . Colon polyps 2014   Benign  . Environmental allergies   . GERD (gastroesophageal reflux disease)   . History of chicken pox   . Migraines   . PTSD (post-traumatic stress disorder)   . Seasonal allergies   . Urine incontinence    Vaginal sling approx 12 yrs ago    Current Outpatient Prescriptions on File Prior to Visit  Medication Sig Dispense Refill  . acyclovir (ZOVIRAX) 400 MG tablet Take 1 tablet (400 mg total) by mouth 3 (three) times daily. Take for 5 days. (Patient taking differently: Take 400 mg by mouth 3 (three) times daily. Take for 5 days. ) 15 tablet 0  . ALPRAZolam (XANAX) 0.5 MG tablet Take 1 tablet (0.5 mg total) by mouth at bedtime as needed for anxiety. 30 tablet 1  . b complex vitamins tablet Take 1 tablet by mouth as needed.    . Bromelains (BROMELAIN PO) Take by mouth as needed.    . Cholecalciferol (VITAMIN D PO) Take 5,000 Units by mouth 2 (two) times daily.     . cyclobenzaprine (FLEXERIL) 10 MG tablet Take 10 mg by mouth as needed for muscle spasms.    . metoCLOPramide (REGLAN) 10 MG tablet Take 10 mg by mouth 3 (three) times daily as needed.      Marland Kitchen OVER THE COUNTER MEDICATION     . OVER THE COUNTER MEDICATION Digestive Aid    . Probiotic Product (PROBIOTIC DAILY PO) Take 1 capsule by mouth daily.    . rizatriptan (MAXALT) 10 MG tablet Take 1 tablet (10 mg total) by mouth once. May repeat in 2 hours if needed 10 tablet 0  . promethazine (PHENERGAN) 12.5 MG tablet Take 1 tablet by mouth as needed.     No current facility-administered medications on file prior to visit.     Allergies  Allergen Reactions  . Metoclopramide Nausea Only  . Gluten Meal Nausea And Vomiting  . Kelp [Iodine] Rash  . Levofloxacin Rash  . Sulfamethoxazole-Trimethoprim Rash    Family History  Problem Relation Age of Onset  . Adopted: Yes  . Mental illness Mother        Manic Depression  . Alcoholism Father   . Stroke Sister        #1 69  . Clotting disorder Sister        #1  . Celiac disease Sister        56  . Raynaud syndrome Sister        #1  . Colitis Sister        Half  . Healthy Brother        Half  . Stroke Brother        Half  . Ovarian cancer Maternal Aunt   .  Cervical cancer Maternal Aunt   . Learning disabilities Son        #1  . Autism Neg Hx        #2  . Anxiety disorder Neg Hx        #3    Social History   Social History  . Marital status: Divorced    Spouse name: N/A  . Number of children: N/A  . Years of education: N/A   Social History Main Topics  . Smoking status: Never Smoker  . Smokeless tobacco: Never Used  . Alcohol use Yes     Comment: rare  . Drug use: No  . Sexual activity: Not Asked   Other Topics Concern  . None   Social History Narrative  . None    Review of Systems - See HPI.  All other ROS are negative.  BP 120/84   Pulse 70   Temp 98.3 F (36.8 C) (Oral)   Resp 14   Ht _0  (1.676 m)   Wt 161 lb (73 kg)   SpO2 99%   BMI 25.99 kg/m   Physical Exam  Constitutional: She is oriented to person, place, and time and well-developed, well-nourished, and in no distress.    HENT:  Head: Normocephalic and atraumatic.  Eyes: Conjunctivae are normal.  Cardiovascular: Normal rate, regular rhythm, normal heart sounds and intact distal pulses.   Pulmonary/Chest: Effort normal and breath sounds normal. No respiratory distress. She has no wheezes. She has no rales. She exhibits no tenderness.  Genitourinary: Uterus normal, cervix normal, right adnexa normal, left adnexa normal and vulva normal. Vagina exhibits normal mucosa and no lesion. Thin  odorless  clear and vaginal discharge found.  Neurological: She is alert and oriented to person, place, and time.  Skin: Skin is warm and dry. No rash noted.  Psychiatric: Affect normal.  Vitals reviewed.  Recent Results (from the past 2160 hour(s))  Urine cytology ancillary only     Status: None   Collection Time: 03/25/16 12:00 AM  Result Value Ref Range   Chlamydia Negative     Comment: Normal Reference Range - Negative   Neisseria gonorrhea Negative     Comment: Normal Reference Range - Negative   Trichomonas Negative     Comment: Normal Reference Range - Negative  Urine cytology ancillary only     Status: Abnormal   Collection Time: 03/25/16 12:00 AM  Result Value Ref Range   Bacterial vaginitis **POSITIVE for Gardnerella vaginalis** (A)     Comment: Normal Reference Range - Negative   Candida vaginitis Negative for Candida Vaginitis Microorganisms     Comment: Normal Reference Range - Negative  Urine culture     Status: None   Collection Time: 03/25/16  4:35 PM  Result Value Ref Range   Organism ID, Bacteria NO GROWTH   Cervicovaginal ancillary only     Status: Abnormal   Collection Time: 04/01/16 12:00 AM  Result Value Ref Range   Wet Prep (BD Affirm) **POSITIVE for Gardnerella** (A)     Comment: Normal Reference Range - Negative  POCT Urinalysis Dipstick     Status: Abnormal   Collection Time: 04/01/16  3:30 PM  Result Value Ref Range   Color, UA yellow    Clarity, UA clear    Glucose, UA negative     Bilirubin, UA negative    Ketones, UA negative    Spec Grav, UA 1.015 1.030 - 1.035   Blood, UA negative  pH, UA 5.0 5.0 - 8.0   Protein, UA negative    Urobilinogen, UA 0.2 Negative - 2.0   Nitrite, UA negative    Leukocytes, UA Trace (A) Negative    Assessment/Plan: 1. Vaginal discharge Urine dip unremarkable for infection. Vaginal exam reveals very mild, thin clear discharge without odor. Likely physiologic as patient asymptomatic. Will treat based on results. Discussed probiotics and proper hygiene. Vinegar wiping discussed.  - POCT Urinalysis Dipstick - Cervicovaginal ancillary only  2. Irregular periods Patient likely in perimenopausal state. Requesting hormone levels. Discussed limited usefulness at present giving she is still having menstrual periods and giving her age and new onset sporadic periods, she is most likely just perimenopausal. Will check levels per her request.  - FSH - LH - Comp Met (CMET)  3. Dysphagia, unspecified type Mentioned at end of visit. Occasional dysphagia of solid foods. EGD recommended previously but patient did not follow through with this recommendations from her previous PCP. Will start Prilosec. GERD diet. Referral to GI for assessment and EGD placed.  - Ambulatory referral to Gastroenterology   Leeanne Rio, PA-C

## 2016-06-05 NOTE — Patient Instructions (Signed)
Please stay well hydrated. Get plenty of rest.  Avoid scented soaps or lotions to the vaginal area.  Continue probiotic. Start OTC Omeprazole.  You will be contacted for assessment by Gastroenterology. I will call you with your results.

## 2016-06-08 LAB — CERVICOVAGINAL ANCILLARY ONLY: Wet Prep (BD Affirm): NEGATIVE

## 2016-07-15 ENCOUNTER — Encounter: Payer: Self-pay | Admitting: Gastroenterology

## 2016-07-15 ENCOUNTER — Other Ambulatory Visit: Payer: Self-pay

## 2016-07-15 ENCOUNTER — Other Ambulatory Visit (INDEPENDENT_AMBULATORY_CARE_PROVIDER_SITE_OTHER): Payer: BLUE CROSS/BLUE SHIELD

## 2016-07-15 ENCOUNTER — Ambulatory Visit (INDEPENDENT_AMBULATORY_CARE_PROVIDER_SITE_OTHER): Payer: BLUE CROSS/BLUE SHIELD | Admitting: Gastroenterology

## 2016-07-15 VITALS — BP 120/60 | HR 74 | Ht 66.0 in | Wt 163.0 lb

## 2016-07-15 DIAGNOSIS — R1310 Dysphagia, unspecified: Secondary | ICD-10-CM

## 2016-07-15 DIAGNOSIS — R8290 Unspecified abnormal findings in urine: Secondary | ICD-10-CM

## 2016-07-15 DIAGNOSIS — K219 Gastro-esophageal reflux disease without esophagitis: Secondary | ICD-10-CM

## 2016-07-15 DIAGNOSIS — K9041 Non-celiac gluten sensitivity: Secondary | ICD-10-CM | POA: Diagnosis not present

## 2016-07-15 LAB — IGA: IGA: 208 mg/dL (ref 68–378)

## 2016-07-15 NOTE — Progress Notes (Signed)
HPI :  46 y/o female with a history of GERD, anxiety, gluten intolerance, seen in consultation for Raiford Noble PA, for GERD and dysphagia.   She reports she has been having reflux for a long time. She reports a history of "hemorrhagic esophagitis" and had been on a "Barrett's watch list" by her previous GI due to concern for the development of Barrett's, although she never has been diagnosed with it. She reports she usually does not have significant heartburn that bothers her. She does have a sore throat and post nasal drip. She reports having some dysphagia with eating solids - she thinks it is intermittent and sporadic. It has been progressive but happening about 10 times per month. She thinks this has been ongoing for 6 months or so. She states rice in particular bothers her, and now other foods.    She has been on PPIs in the past but is concerned she is not supposed to be on it "due to risk of magnesium deficiency" and wants her symptoms treated more "holistically". She is very concerned about long term risks of PPIs.. She has been on famotidine but states she is concerned her "acid levels are not high enough". She is on a more vegetarian / plant based diet.   She thinks she is gluten intolerant - has nausea for weeks if she ingests any gluten. She has been taking "digestive enzymes" supplement to help with this. .Celiac runs in the family per her report - mother and sister both have it from what she endorses. She tries to eat a gluten free diet. She reports she has been on a gluten free diet but states prior testing for celiac was negative, but done on the gluten free diet. She has never had genetic testing. She is concerned that she is not absorbing her nutrients well due to this issue  She's had a prior endoscopy - last was done 3 years ago, no records available  She is listed to have Reglan for history of nausea, she states she takes it only as needed rarely.   Colonoscopy 3 years ago,  unclear results, she was told she had a polyp found.    Past Medical History:  Diagnosis Date  . Anxiety and depression    Hx of  . Colon polyps 2014   Benign  . Environmental allergies   . GERD (gastroesophageal reflux disease)   . History of chicken pox   . Migraines   . PTSD (post-traumatic stress disorder)   . Seasonal allergies   . Urine incontinence    Vaginal sling approx 12 yrs ago     Past Surgical History:  Procedure Laterality Date  . CHOLECYSTECTOMY  2008  . COLONOSCOPY    . ENDOMETRIAL ABLATION  ~2011   [facility]  . ESOPHAGOGASTRODUODENOSCOPY ENDOSCOPY    . TONSILLECTOMY  20 years ago  . TUBAL LIGATION    . WISDOM TOOTH EXTRACTION     Family History  Problem Relation Age of Onset  . Adopted: Yes  . Mental illness Mother        Manic Depression  . Alcoholism Father   . Stroke Sister        #1 58  . Clotting disorder Sister        #1  . Celiac disease Sister        74  . Raynaud syndrome Sister        #1  . Colitis Sister  Half  . Healthy Brother        Half  . Stroke Brother        Half  . Ovarian cancer Maternal Aunt   . Cervical cancer Maternal Aunt   . Learning disabilities Son        #1  . Autism Neg Hx        #2  . Anxiety disorder Neg Hx        #3   Social History  Substance Use Topics  . Smoking status: Never Smoker  . Smokeless tobacco: Never Used  . Alcohol use Yes     Comment: rare   Current Outpatient Prescriptions  Medication Sig Dispense Refill  . acyclovir (ZOVIRAX) 400 MG tablet Take 1 tablet (400 mg total) by mouth 3 (three) times daily. Take for 5 days. (Patient taking differently: Take 400 mg by mouth 3 (three) times daily. Take for 5 days. ) 15 tablet 0  . ALPRAZolam (XANAX) 0.5 MG tablet Take 1 tablet (0.5 mg total) by mouth at bedtime as needed for anxiety. 30 tablet 1  . b complex vitamins tablet Take 1 tablet by mouth as needed.    . Bromelains (BROMELAIN PO) Take by mouth as needed.    .  Cholecalciferol (VITAMIN D PO) Take 5,000 Units by mouth 2 (two) times daily.     . cyclobenzaprine (FLEXERIL) 10 MG tablet Take 10 mg by mouth as needed for muscle spasms.    . metoCLOPramide (REGLAN) 10 MG tablet Take 10 mg by mouth 3 (three) times daily as needed.     Marland Kitchen OVER THE COUNTER MEDICATION     . OVER THE COUNTER MEDICATION Digestive Aid    . Probiotic Product (PROBIOTIC DAILY PO) Take 1 capsule by mouth daily.    . promethazine (PHENERGAN) 12.5 MG tablet Take 1 tablet by mouth as needed.    . rizatriptan (MAXALT) 10 MG tablet Take 1 tablet (10 mg total) by mouth once. May repeat in 2 hours if needed 10 tablet 0   No current facility-administered medications for this visit.    Allergies  Allergen Reactions  . Metoclopramide Nausea Only  . Gluten Meal Nausea And Vomiting  . Kelp [Iodine] Rash  . Levofloxacin Rash  . Sulfamethoxazole-Trimethoprim Rash     Review of Systems: All systems reviewed and negative except where noted in HPI.   Lab Results  Component Value Date   WBC 8.4 09/18/2015   HGB 12.8 09/18/2015   HCT 37.9 09/18/2015   MCV 86.8 09/18/2015   PLT 324.0 09/18/2015    Lab Results  Component Value Date   CREATININE 0.81 06/05/2016   BUN 15 06/05/2016   NA 137 06/05/2016   K 4.3 06/05/2016   CL 101 06/05/2016   CO2 31 06/05/2016    Lab Results  Component Value Date   ALT 23 06/05/2016   AST 18 06/05/2016   ALKPHOS 69 06/05/2016   BILITOT 0.6 06/05/2016     Physical Exam: BP 120/60   Pulse 74   Ht 5\' 6"  (1.676 m)   Wt 163 lb (73.9 kg)   BMI 26.31 kg/m  Constitutional: Pleasant,well-developed, female in no acute distress. HEENT: Normocephalic and atraumatic. Conjunctivae are normal. No scleral icterus. Neck supple.  Cardiovascular: Normal rate, regular rhythm.  Pulmonary/chest: Effort normal and breath sounds normal. No wheezing, rales or rhonchi. Abdominal: Soft, nondistended, nontender. There are no masses palpable. No  hepatomegaly. Extremities: no edema Lymphadenopathy: No cervical adenopathy noted. Neurological: Alert  and oriented to person place and time. Skin: Skin is warm and dry. No rashes noted. Psychiatric: Normal mood and affect. Behavior is normal.   ASSESSMENT AND PLAN: 46 year old female with long-standing history of reflux, here for new patient visit to discuss management of reflux and relatively new symptoms of dysphagia.  GERD / Dysphagia - I discussed differential for dysphagia, and I'm recommending an upper endoscopy to further evaluate and treat if this is a new symptom for her. She does endorse a history of esophagitis although doesn't have much reflux symptoms at present. She wishes to manage her reflux without the use of the PPI and use more natural therapies. I discussed PPIs with her and long-term potential associated risks, specifically regarding her belief that this will cause her severe magnesium deficiency, that is extremely rare. She reports she was under a surveillance program for "potential"development of Barrett's but never actually had Barrett's diagnosed in the past, not sure why she was having surveillance and will request records of prior EGD. I discussed with her that if she does have Barrett's esophagus, current national guidelines recommend the use of PPI to minimize the risk of esophageal cancer development. We will await the results of her endoscopy with further recommendations regarding her long-term management. Okay to continue pepsid as needed for now. If she does not have Barrett's she can use whatever regimen works for her. Following a discussion of the risks and benefits of endoscopy and anesthesia he wanted to proceed.  Gluten intolerance - she reports multiple family members with celiac disease and that she has remained on a gluten-free diet although her testing for celiac disease has been negative in the past. Unclear how accurate this testing was if she is on a  gluten-free diet. We discussed that if she does have celiac disease I would recommend celiac serologies to ensure negative, and we will biopsy the small bowel for further evaluation during her EGD. We can consider genetic testing to evaluate further for celiac disease if the diagnosis remains in question, I counseled her that a negative test would definitively rule out however we cannot make any conclusions regarding a positive result. We'll hold off on genetic testing for now and await results for endoscopy and labs.  Otherwise will request the report from her prior colonoscopy to clarify with the finding was and clarify when she is next due for surveillance. In addition, she asked for a UA and urine culture today to rule out a UTI due to dysuria, will order.   Holland Cellar, MD [facility] Pager [phone]  CC: Brunetta Jeans, PA-C

## 2016-07-15 NOTE — Patient Instructions (Signed)
If you are age 46 or older, your body mass index should be between 23-30. Your Body mass index is 26.31 kg/m. If this is out of the aforementioned range listed, please consider follow up with your Primary Care Provider.  If you are age 34 or younger, your body mass index should be between 19-25. Your Body mass index is 26.31 kg/m. If this is out of the aformentioned range listed, please consider follow up with your Primary Care Provider.   Your physician has requested that you go to the basement for the following lab work before leaving today:   Celiac  You have been scheduled for an endoscopy. Please follow written instructions given to you at your visit today. If you use inhalers (even only as needed), please bring them with you on the day of your procedure. Your physician has requested that you go to www.startemmi.com and enter the access code given to you at your visit today. This web site gives a general overview about your procedure. However, you should still follow specific instructions given to you by our office regarding your preparation for the procedure.  Thank you.

## 2016-07-16 LAB — TISSUE TRANSGLUTAMINASE, IGA: TISSUE TRANSGLUTAMINASE AB, IGA: 1 U/mL (ref <4)

## 2016-07-17 ENCOUNTER — Ambulatory Visit (AMBULATORY_SURGERY_CENTER): Payer: BLUE CROSS/BLUE SHIELD | Admitting: Gastroenterology

## 2016-07-17 ENCOUNTER — Encounter: Payer: Self-pay | Admitting: Gastroenterology

## 2016-07-17 ENCOUNTER — Other Ambulatory Visit: Payer: BLUE CROSS/BLUE SHIELD

## 2016-07-17 VITALS — BP 127/64 | HR 60 | Temp 98.6°F | Resp 11 | Ht 66.0 in | Wt 163.0 lb

## 2016-07-17 DIAGNOSIS — K317 Polyp of stomach and duodenum: Secondary | ICD-10-CM | POA: Diagnosis not present

## 2016-07-17 DIAGNOSIS — R1310 Dysphagia, unspecified: Secondary | ICD-10-CM

## 2016-07-17 MED ORDER — SODIUM CHLORIDE 0.9 % IV SOLN: 500.0000 mL | INTRAVENOUS | Status: DC

## 2016-07-17 NOTE — Progress Notes (Signed)
Pt's states no medical or surgical changes since previsit or office visit.  No egg or soy allergy  Pt states she woke up with nausea and headache this morning  but thinks because hasn't eaten from 1 pm yesterday   Called to room to assist during endoscopic procedure.  Patient ID and intended procedure confirmed with present staff. Received instructions for my participation in the procedure from the performing physician.

## 2016-07-17 NOTE — Progress Notes (Signed)
Dental advisory given to [place] and oriented x3, pleased with MAC, report to RN

## 2016-07-17 NOTE — Patient Instructions (Signed)
YOU HAD AN ENDOSCOPIC PROCEDURE TODAY AT [facility]:   Refer to the procedure report that was given to you for any specific questions about what was found during the examination.  If the procedure report does not answer your questions, please call your gastroenterologist to clarify.  If you requested that your care partner not be given the details of your procedure findings, then the procedure report has been included in a sealed envelope for you to review at your convenience later.  YOU SHOULD EXPECT: Some feelings of bloating in the abdomen. Passage of more gas than usual.  Walking can help get rid of the air that was put into your GI tract during the procedure and reduce the bloating. If you had a lower endoscopy (such as a colonoscopy or flexible sigmoidoscopy) you may notice spotting of blood in your stool or on the toilet paper. If you underwent a bowel prep for your procedure, you may not have a normal bowel movement for a few days.  Please Note:  You might notice some irritation and congestion in your nose or some drainage.  This is from the oxygen used during your procedure.  There is no need for concern and it should clear up in a day or so.  SYMPTOMS TO REPORT IMMEDIATELY:   Following upper endoscopy (EGD)  Vomiting of blood or coffee ground material  New chest pain or pain under the shoulder blades  Painful or persistently difficult swallowing  New shortness of breath  Fever of 100F or higher  Black, tarry-looking stools  For urgent or emergent issues, a gastroenterologist can be reached at any hour by calling [phone].   DIET:  We do recommend a small meal at first, but then you may proceed to your regular diet.  Drink plenty of fluids but you should avoid alcoholic beverages for 24 hours.  MEDICATIONS: Continue present medications including Famotidine as needed.  ACTIVITY:  You should plan to take it easy for the rest of today and you should NOT DRIVE or  use heavy machinery until tomorrow (because of the sedation medicines used during the test).    FOLLOW UP: Our staff will call the number listed on your records the next business day following your procedure to check on you and address any questions or concerns that you may have regarding the information given to you following your procedure. If we do not reach you, we will leave a message.  However, if you are feeling well and you are not experiencing any problems, there is no need to return our call.  We will assume that you have returned to your regular daily activities without incident.  If any biopsies were taken you will be contacted by phone or by letter within the next 1-3 weeks.  Please call us at [phone] if you have not heard about the biopsies in 3 weeks.   Thank for allowing [place] to provide for your healthcare needs today.  SIGNATURES/CONFIDENTIALITY: You and/or your care partner have signed paperwork which will be entered into your electronic medical record.  These signatures attest to the fact that that the information above on your After Visit Summary has been reviewed and is understood.  Full responsibility of the confidentiality of this discharge information lies with you and/or your care-partner.

## 2016-07-17 NOTE — Op Note (Signed)
[place] Patient Name: Laura Dougherty Procedure Date: 07/17/2016 7:59 AM MRN: [id number] Endoscopist: Remo Lipps P. Armbruster MD, MD Age: 46 Referring MD:  Date of Birth: 06-16-1970 Gender: Female Account #: [id number] Procedure:                Upper GI endoscopy Indications:              Dysphagia, Heartburn, gluten intolerance / possible                            celiac disease, on famotidine as needed Medicines:                Monitored Anesthesia Care Procedure:                Pre-Anesthesia Assessment:                           - Prior to the procedure, a History and Physical                            was performed, and patient medications and                            allergies were reviewed. The patient's tolerance of                            previous anesthesia was also reviewed. The risks                            and benefits of the procedure and the sedation                            options and risks were discussed with the patient.                            All questions were answered, and informed consent                            was obtained. Prior Anticoagulants: The patient has                            taken no previous anticoagulant or antiplatelet                            agents. ASA Grade Assessment: II - A patient with                            mild systemic disease. After reviewing the risks                            and benefits, the patient was deemed in                            satisfactory condition to undergo the procedure.  After obtaining informed consent, the endoscope was                            passed under direct vision. Throughout the                            procedure, the patient's blood pressure, pulse, and                            oxygen saturations were monitored continuously. The                            Endoscope was introduced through the mouth, and   advanced to the second part of duodenum. The upper                            GI endoscopy was accomplished without difficulty.                            The patient tolerated the procedure well. Scope In: Scope Out: Findings:                 Esophagogastric landmarks were identified: the                            Z-line was found at 38 cm, the gastroesophageal                            junction was found at 38 cm and the upper extent of                            the gastric folds was found at 38 cm from the                            incisors.                           The exam of the esophagus was otherwise normal. No                            stenosis / stricture, no inflammatory changes, no                            Barrett's esophagus                           A guidewire was placed and the scope was withdrawn.                            Empiric dilation was performed in the entire                            esophagus with a Savary dilator with no resistance  at 17 mm and 18 mm. Biopsies were taken with a cold                            forceps in the upper third of the esophagus, in the                            middle third of the esophagus and in the lower                            third of the esophagus for histology. Relook                            endoscopy following dilation showed no mucosal                            wrents                           A few small benign appering sessile polyps were                            found in the gastric fundus and in the gastric                            body. A representative polyp was removed with a                            cold biopsy forceps. Resection and retrieval were                            complete.                           The exam of the stomach was otherwise normal.                           The duodenal bulb and second portion of the                            duodenum were  normal. Biopsies for histology were                            taken with a cold forceps for evaluation of celiac                            disease. Complications:            No immediate complications. Estimated blood loss:                            Minimal. Estimated Blood Loss:     Estimated blood loss was minimal. Impression:               - Esophagogastric landmarks identified.                           -  Normal appearing esophagus - empirically dilated                            to 55mm with no mucosal wrents, biopsies obtained                            to rule out eosinophilic esophagitis                           - A few gastric polyps. One representative polyp                            was resected and retrieved.                           - Normal stomach otherwise                           - Normal duodenal bulb and second portion of the                            duodenum. Biopsied. Recommendation:           - Patient has a contact number available for                            emergencies. The signs and symptoms of potential                            delayed complications were discussed with the                            patient. Return to normal activities tomorrow.                            Written discharge instructions were provided to the                            patient.                           - Resume previous diet.                           - Continue present medications including famotidine                            as needed                           - Await pathology results and course following                            dilation                           - Consideration for esophageal manometry if  dysphagia persists Remo Lipps P. Armbruster MD, MD 07/17/2016 8:25:04 AM This report has been signed electronically.

## 2016-07-19 LAB — URINE CULTURE

## 2016-07-20 ENCOUNTER — Telehealth: Payer: Self-pay | Admitting: *Deleted

## 2016-07-20 ENCOUNTER — Other Ambulatory Visit: Payer: Self-pay

## 2016-07-20 ENCOUNTER — Encounter: Payer: Self-pay | Admitting: Gastroenterology

## 2016-07-20 MED ORDER — NITROFURANTOIN MONOHYD MACRO 100 MG PO CAPS: 100.0000 mg | ORAL_CAPSULE | Freq: Two times a day (BID) | ORAL | 0 refills | Status: DC

## 2016-07-20 NOTE — Telephone Encounter (Signed)
  Follow up Call-  Call back number 07/17/2016  Post procedure Call Back phone  # [phone]  Permission to leave phone message Yes     Patient questions:  Do you have a fever, pain , or abdominal swelling? No. Pain Score  0 *  Have you tolerated food without any problems? Yes.    Have you been able to return to your normal activities? Yes.    Do you have any questions about your discharge instructions: Diet   No. Medications  No. Follow up visit  No.  Do you have questions or concerns about your Care? Yes.    Actions: * If pain score is 4 or above: No action needed, pain <4.

## 2016-07-24 ENCOUNTER — Encounter: Payer: Self-pay | Admitting: Gastroenterology

## 2016-07-27 ENCOUNTER — Telehealth: Payer: Self-pay | Admitting: Gastroenterology

## 2016-07-27 NOTE — Telephone Encounter (Signed)
FYI: Spoke to patient to let her know what we received her previous GI records. Patient also received these records today. She states that she was verbally told, by that physician, different information from what is on the reports. Was told that no colon biopsies were taken, told she does have Barrett's esophagus. Right now she is very apprehensive to proceed with a colonoscopy and having anesthesia. States that she is having memory issues and does not want to do anything that may cause further damage. She will let us know if/when she wants to schedule a colonoscopy.

## 2016-07-27 NOTE — Telephone Encounter (Signed)
Prior records received - Dr. Virgel Bouquet performed the following exams  Colonoscopy 06/29/2013 - inadequate prep, 36mm adenoma removed 12cm from anal verge EGD 12/05/2012 - extensive biopsies taken - 14 jars - no evidence of celiac diseases, mild chronic inactive gastritis (HP negative), no evidence of Barrett's esophagus or eosinophilic esophagitis  Laura Dougherty can you please relay to this patient that it was reported she had an inadequate bowel prep on her colonoscopy, and with an adenoma removed they recommended a follow up colonoscopy 3 years after her initial exam (due 06/2016). If she wants to proceed with a colonoscopy can you help schedule her?  Otherwise, she had no evidence of Barrett's esophagus or celiac on her endoscopy in 2014 and most recently. She does not need any further EGDs for surveillance - she does not have Barrett's. Thanks

## 2016-07-27 NOTE — Telephone Encounter (Signed)
Okay, thanks for the update. She will need to address the discrepancy with the physician who told her these things in the past. We can only relay what we see in the records. If she had a precancerous polyp removed from her colon with a fair prep noted, colonoscopy is recommended.

## 2016-07-29 ENCOUNTER — Other Ambulatory Visit (HOSPITAL_COMMUNITY)
Admission: RE | Admit: 2016-07-29 | Discharge: 2016-07-29 | Disposition: A | Discharge status: Home / Self Care | Payer: BLUE CROSS/BLUE SHIELD | Source: Ambulatory Visit | Attending: Physician Assistant | Admitting: Physician Assistant

## 2016-07-29 ENCOUNTER — Ambulatory Visit (INDEPENDENT_AMBULATORY_CARE_PROVIDER_SITE_OTHER): Payer: BLUE CROSS/BLUE SHIELD | Admitting: Physician Assistant

## 2016-07-29 ENCOUNTER — Encounter: Payer: Self-pay | Admitting: Physician Assistant

## 2016-07-29 VITALS — BP 120/70 | HR 74 | Temp 98.6°F | Resp 14 | Ht 66.0 in | Wt 164.0 lb

## 2016-07-29 DIAGNOSIS — R300 Dysuria: Secondary | ICD-10-CM | POA: Diagnosis not present

## 2016-07-29 LAB — POCT URINALYSIS DIPSTICK
Bilirubin, UA: NEGATIVE
Blood, UA: NEGATIVE
Glucose, UA: NEGATIVE
Ketones, UA: NEGATIVE
Leukocytes, UA: NEGATIVE
Nitrite, UA: NEGATIVE
Protein, UA: NEGATIVE
Spec Grav, UA: >=1.03 — AB (ref 1.010–1.025)
Urobilinogen, UA: 0.2 E.U./dL
pH, UA: 5 (ref 5.0–8.0)

## 2016-07-29 MED ORDER — AMOXICILLIN-POT CLAVULANATE 500-125 MG PO TABS: 1.0000 | ORAL_TABLET | Freq: Two times a day (BID) | ORAL | 0 refills | Status: DC

## 2016-07-29 NOTE — Progress Notes (Signed)
Pre visit review using our clinic review tool, if applicable. No additional management support is needed unless otherwise documented below in the visit note. 

## 2016-07-29 NOTE — Progress Notes (Signed)
Patient presents to clinic today c/o continued urinary symptoms s/p treatment by GI for a UTI. Patient was seen by GI on 07/15/16 with for unrelated issues. Noted symptoms at that time and requested testing Urine culture revealed E. Coli UTI. Patient was started on 5-days of Macrobid. Endorses taking as directed. Noted only slight improvement in symptoms with antibiotic. Is noting dysuria, urinary hesitancy and urgency. Does note vaginal discharge at present but states this is normal for her. Denies vaginal itch, pain. + history of BV.  Past Medical History:  Diagnosis Date  . Anxiety and depression    Hx of  . Colon polyps 2014   Benign  . Environmental allergies   . GERD (gastroesophageal reflux disease)   . History of chicken pox   . Migraines   . PTSD (post-traumatic stress disorder)   . Seasonal allergies   . Urine incontinence    Vaginal sling approx 12 yrs ago    Current Outpatient Prescriptions on File Prior to Visit  Medication Sig Dispense Refill  . acyclovir (ZOVIRAX) 400 MG tablet Take 1 tablet (400 mg total) by mouth 3 (three) times daily. Take for 5 days. (Patient taking differently: Take 400 mg by mouth 3 (three) times daily. Take for 5 days. ) 15 tablet 0  . ALPRAZolam (XANAX) 0.5 MG tablet Take 1 tablet (0.5 mg total) by mouth at bedtime as needed for anxiety. 30 tablet 1  . Ascorbic Acid (VITAMIN C PO) Take 1,500 mg by mouth daily.    Marland Kitchen b complex vitamins tablet Take 1 tablet by mouth as needed.    . Bromelains (BROMELAIN PO) Take by mouth as needed.    . Cholecalciferol (VITAMIN D PO) Take 5,000 Units by mouth 2 (two) times daily.     . cyclobenzaprine (FLEXERIL) 10 MG tablet Take 10 mg by mouth as needed for muscle spasms.    . famotidine (PEPCID) 40 MG tablet Take 40 mg by mouth daily.    . metoCLOPramide (REGLAN) 10 MG tablet Take 10 mg by mouth 3 (three) times daily as needed.     Marland Kitchen OVER THE COUNTER MEDICATION     . OVER THE COUNTER MEDICATION Digestive Aid      . Probiotic Product (PROBIOTIC DAILY PO) Take 1 capsule by mouth daily.    . rizatriptan (MAXALT) 10 MG tablet Take 1 tablet (10 mg total) by mouth once. May repeat in 2 hours if needed 10 tablet 0   No current facility-administered medications on file prior to visit.     Allergies  Allergen Reactions  . Gluten Meal Nausea And Vomiting  . Kelp [Iodine] Rash  . Levofloxacin Rash  . Sulfamethoxazole-Trimethoprim Rash    allergic to Bactrim     Family History  Problem Relation Age of Onset  . Adopted: Yes  . Mental illness Mother        Manic Depression  . Alcoholism Father   . Stroke Sister        #1 15  . Clotting disorder Sister        #1  . Celiac disease Sister        54  . Raynaud syndrome Sister        #1  . Colitis Sister        Half  . Healthy Brother        Half  . Stroke Brother        Half  . Ovarian cancer Maternal Aunt   . Cervical  cancer Maternal Aunt   . Learning disabilities Son        #1  . Autism Neg Hx        #2  . Anxiety disorder Neg Hx        #3    Social History   Social History  . Marital status: Divorced    Spouse name: N/A  . Number of children: N/A  . Years of education: N/A   Social History Main Topics  . Smoking status: Never Smoker  . Smokeless tobacco: Never Used  . Alcohol use Yes     Comment: rare  . Drug use: No     Comment: CBD oil   . Sexual activity: Not Asked   Other Topics Concern  . None   Social History Narrative  . None   Review of Systems - See HPI.  All other ROS are negative.  BP 120/70   Pulse 74   Temp 98.6 F (37 C) (Oral)   Resp 14   Ht 5' 6"  (1.676 m)   Wt 164 lb (74.4 kg)   LMP 07/09/2016 Comment: period every 3-4 months   SpO2 99%   BMI 26.47 kg/m   Physical Exam  Constitutional: She is oriented to person, place, and time and well-developed, well-nourished, and in no distress.  HENT:  Head: Normocephalic and atraumatic.  Eyes: Conjunctivae are normal.  Neck: Neck supple.   Cardiovascular: Normal rate, regular rhythm, normal heart sounds and intact distal pulses.   Pulmonary/Chest: Effort normal and breath sounds normal. No respiratory distress. She has no wheezes. She has no rales. She exhibits no tenderness.  Abdominal: Soft. Bowel sounds are normal. She exhibits no distension. There is no tenderness.  Neurological: She is alert and oriented to person, place, and time.  Skin: Skin is warm and dry. No rash noted.  Psychiatric: Affect normal.  Vitals reviewed.   Recent Results (from the past 2160 hour(s))  Cervicovaginal ancillary only     Status: None   Collection Time: 06/05/16 12:00 AM  Result Value Ref Range   Wet Prep (BD Affirm) Negative for Candida, Gardnerella, & Trichomonas     Comment: Normal Reference Range - Negative  POCT Urinalysis Dipstick     Status: Normal   Collection Time: 06/05/16  9:38 AM  Result Value Ref Range   Color, UA yellow    Clarity, UA clear    Glucose, UA negative    Bilirubin, UA negative    Ketones, UA negative    Spec Grav, UA 1.015 1.010 - 1.025   Blood, UA negative    pH, UA 7.0 5.0 - 8.0   Protein, UA negative    Urobilinogen, UA 0.2 0.2 or 1.0 E.U./dL   Nitrite, UA negative    Leukocytes, UA Negative Negative  FSH     Status: None   Collection Time: 06/05/16 10:20 AM  Result Value Ref Range   FSH 30.8 mIU/ML    Comment: Female Reference Range:  1.4-18.1 mIU/mLFemale Reference Range:Follicular Phase          2.5-10.2 mIU/mLMidCycle Peak          3.4-33.4 mIU/mLLuteal Phase          1.5-9.1 mIU/mLPost Menopausal     23.0-116.3 mIU/mLPregnant          <0.3 mIU/mL  LH     Status: None   Collection Time: 06/05/16 10:20 AM  Result Value Ref Range   LH 34.98 mIU/mL    Comment:  Female Reference Range:20-70 yrs     1.5-9.3 mIU/mL>70 yrs       3.1-35.6 mIU/mLFemale Reference Range:Follicular Phase     5.0-56.9 mIU/mLMidcycle             8.7-76.3 mIU/mLLuteal Phase         0.5-16.9 mIU/mL  Post Menopausal      15.9-54.0   mIU/mLPregnant             <1.5 mIU/mLContraceptives       0.7-5.6 mIU/mL   Comp Met (CMET)     Status: None   Collection Time: 06/05/16 10:20 AM  Result Value Ref Range   Sodium 137 135 - 145 mEq/L   Potassium 4.3 3.5 - 5.1 mEq/L   Chloride 101 96 - 112 mEq/L   CO2 31 19 - 32 mEq/L   Glucose, Bld 90 70 - 99 mg/dL   BUN 15 6 - 23 mg/dL   Creatinine, Ser 0.81 0.40 - 1.20 mg/dL   Total Bilirubin 0.6 0.2 - 1.2 mg/dL   Alkaline Phosphatase 69 39 - 117 U/L   AST 18 0 - 37 U/L   ALT 23 0 - 35 U/L   Total Protein 7.2 6.0 - 8.3 g/dL   Albumin 4.5 3.5 - 5.2 g/dL   Calcium 9.6 8.4 - 10.5 mg/dL   GFR 81.09 >60.00 mL/min  Urine Culture     Status: None   Collection Time: 07/15/16 12:00 AM  Result Value Ref Range   Culture ESCHERICHIA COLI    Colony Count Greater than 100,000 CFU/mL    Organism ID, Bacteria ESCHERICHIA COLI       Susceptibility   Escherichia coli -  (no method available)    AMPICILLIN <=2 Sensitive     AMOX/CLAVULANIC <=2 Sensitive     AMPICILLIN/SULBACTAM <=2 Sensitive     PIP/TAZO <=4 Sensitive     IMIPENEM <=0.25 Sensitive     CEFAZOLIN <=4 Not Reportable     CEFTRIAXONE <=1 Sensitive     CEFTAZIDIME <=1 Sensitive     CEFEPIME <=1 Sensitive     GENTAMICIN <=1 Sensitive     TOBRAMYCIN <=1 Sensitive     CIPROFLOXACIN <=0.25 Sensitive     LEVOFLOXACIN <=0.12 Sensitive     NITROFURANTOIN <=16 Sensitive     TRIMETH/SULFA* <=20 Sensitive      * NR=NOT REPORTABLE,SEE COMMENTORAL therapy:A cefazolin MIC of <32 predicts susceptibility to the oral agents cefaclor,cefdinir,cefpodoxime,cefprozil,cefuroxime,cephalexin,and loracarbef when used for therapy of uncomplicated UTIs due to E.coli,K.pneumomiae,and P.mirabilis. PARENTERAL therapy: A cefazolinMIC of >8 indicates resistance to parenteralcefazolin. An alternate test method must beperformed to confirm susceptibility to parenteralcefazolin.  Tissue transglutaminase, IgA     Status: None   Collection Time: 07/15/16  4:33 PM   Result Value Ref Range   Tissue Transglutaminase Ab, IgA 1 <4 U/mL    Comment: Value Interpretation:   <4:   Antibody Not Detected  >=4:   Antibody Detected   IgA     Status: None   Collection Time: 07/15/16  4:33 PM  Result Value Ref Range   IgA 208 68 - 378 mg/dL    Comment:    POCT Urinalysis Dipstick     Status: Abnormal   Collection Time: 07/29/16  4:42 PM  Result Value Ref Range   Color, UA yellow    Clarity, UA clear    Glucose, UA negative    Bilirubin, UA negative    Ketones, UA negative    Spec Grav, UA >=1.030 (A)  1.010 - 1.025   Blood, UA negative    pH, UA 5.0 5.0 - 8.0   Protein, UA negative    Urobilinogen, UA 0.2 0.2 or 1.0 E.U./dL   Nitrite, UA negative    Leukocytes, UA Negative Negative   Assessment/Plan: 1. Dysuria Repeat UA and culture. Will add-on testing for BV. Will start Augmentin 500-125 per last culture sensitivities. Supportive measures reviewed. Will alter regimen based on results and response. Discussed with her that we may need Urology input giving recurrent infections.  - POCT Urinalysis Dipstick - Urine Culture - Urine cytology ancillary only   Leeanne Rio, PA-C

## 2016-07-29 NOTE — Patient Instructions (Signed)
Please take antibiotic as directed with food. I will call you with your results. We will alter regimen accordingly.  We may need urology assessment due to recurring infections.  I will set you up with Neurology for assessment of memory and speech at your request. You will receive a phone call from their office.

## 2016-07-30 DIAGNOSIS — R300 Dysuria: Secondary | ICD-10-CM | POA: Insufficient documentation

## 2016-07-31 LAB — URINE CULTURE

## 2016-08-04 LAB — URINE CYTOLOGY ANCILLARY ONLY: BACTERIAL VAGINITIS: NEGATIVE

## 2016-08-05 ENCOUNTER — Encounter: Payer: Self-pay | Admitting: Physician Assistant

## 2016-08-06 ENCOUNTER — Other Ambulatory Visit: Payer: Self-pay

## 2016-08-06 ENCOUNTER — Other Ambulatory Visit: Payer: Self-pay | Admitting: Physician Assistant

## 2016-08-06 ENCOUNTER — Encounter: Payer: Self-pay | Admitting: Neurology

## 2016-08-06 DIAGNOSIS — R413 Other amnesia: Secondary | ICD-10-CM

## 2016-08-06 DIAGNOSIS — N63 Unspecified lump in unspecified breast: Secondary | ICD-10-CM

## 2016-08-20 ENCOUNTER — Encounter: Payer: Self-pay | Admitting: Physician Assistant

## 2016-08-20 ENCOUNTER — Other Ambulatory Visit: Payer: Self-pay | Admitting: Physician Assistant

## 2016-08-20 DIAGNOSIS — N63 Unspecified lump in unspecified breast: Secondary | ICD-10-CM

## 2016-08-20 DIAGNOSIS — N390 Urinary tract infection, site not specified: Secondary | ICD-10-CM

## 2016-08-21 ENCOUNTER — Other Ambulatory Visit (INDEPENDENT_AMBULATORY_CARE_PROVIDER_SITE_OTHER): Payer: BLUE CROSS/BLUE SHIELD

## 2016-08-21 DIAGNOSIS — N390 Urinary tract infection, site not specified: Secondary | ICD-10-CM

## 2016-08-21 LAB — URINALYSIS, ROUTINE W REFLEX MICROSCOPIC
BILIRUBIN URINE: NEGATIVE
Hgb urine dipstick: NEGATIVE
Ketones, ur: NEGATIVE
Leukocytes, UA: NEGATIVE
NITRITE: NEGATIVE
PH: 6 (ref 5.0–8.0)
RBC / HPF: NONE SEEN (ref 0–?)
Specific Gravity, Urine: 1.025 (ref 1.000–1.030)
TOTAL PROTEIN, URINE-UPE24: NEGATIVE
Urine Glucose: NEGATIVE
Urobilinogen, UA: 0.2 (ref 0.0–1.0)

## 2016-08-24 MED ORDER — CEPHALEXIN 500 MG PO CAPS: 500.0000 mg | ORAL_CAPSULE | Freq: Two times a day (BID) | ORAL | 0 refills | Status: AC

## 2016-08-24 NOTE — Addendum Note (Signed)
Addended by: Brunetta Jeans on: 08/24/2016 01:22 PM   Modules accepted: Orders

## 2016-08-25 ENCOUNTER — Other Ambulatory Visit: Payer: BLUE CROSS/BLUE SHIELD

## 2016-08-25 ENCOUNTER — Telehealth: Payer: Self-pay | Admitting: Emergency Medicine

## 2016-08-25 DIAGNOSIS — N390 Urinary tract infection, site not specified: Secondary | ICD-10-CM

## 2016-08-25 NOTE — Telephone Encounter (Signed)
Dysuria. Thank you

## 2016-08-25 NOTE — Telephone Encounter (Signed)
Laura Dougherty, Patient came in today for a urine culture. If you let me know what the Dz is I will place the orders.  Thanks

## 2016-08-25 NOTE — Telephone Encounter (Signed)
Orderd and sent thank you Einar Pheasant and sorry about Friday.

## 2016-08-25 NOTE — Telephone Encounter (Signed)
She also has already taking an antibiotic, she left her specimen.

## 2016-08-26 ENCOUNTER — Other Ambulatory Visit: Payer: BLUE CROSS/BLUE SHIELD

## 2016-08-27 ENCOUNTER — Encounter: Payer: Self-pay | Admitting: Physician Assistant

## 2016-08-27 ENCOUNTER — Telehealth: Payer: Self-pay | Admitting: *Deleted

## 2016-08-27 LAB — URINE CULTURE: ORGANISM ID, BACTERIA: NO GROWTH

## 2016-08-27 NOTE — Telephone Encounter (Signed)
She is welcome to finish course if symptoms are improving with medication. This should take care of things until her assessment with specialist. If not, she would need reassessment (would recommend recheck for vaginal infections).

## 2016-08-27 NOTE — Telephone Encounter (Signed)
Patient is concerned that she cannot get in to the urologist for 3 weeks.  She is questioning if this is acceptable since she has still been having issues and pressure in her pelvis.  She states that since she has been on the abx the urine is starting to clear up and the odor has gone. She said that she is going to finish the abx because she does not want this to come back.

## 2016-08-28 NOTE — Telephone Encounter (Signed)
Patient is concerned with her issues being related to her sexual activity.  She states that she has recently been sexually active and she is wondering if some of this is related.  She is aware that she is okay to finish her current course of abx, and if symptoms come back after that, and before she sees the urologist, she will need reevaluation for any infection.

## 2016-09-04 ENCOUNTER — Ambulatory Visit (INDEPENDENT_AMBULATORY_CARE_PROVIDER_SITE_OTHER): Payer: BLUE CROSS/BLUE SHIELD | Admitting: Family Medicine

## 2016-09-04 ENCOUNTER — Encounter: Payer: Self-pay | Admitting: Family Medicine

## 2016-09-04 VITALS — BP 104/70 | HR 87 | Temp 98.7°F | Ht 66.0 in | Wt 165.0 lb

## 2016-09-04 DIAGNOSIS — N399 Disorder of urinary system, unspecified: Secondary | ICD-10-CM | POA: Diagnosis not present

## 2016-09-04 LAB — POC URINALSYSI DIPSTICK (AUTOMATED)
BILIRUBIN UA: NEGATIVE
Blood, UA: NEGATIVE
GLUCOSE UA: NEGATIVE
Ketones, UA: NEGATIVE
LEUKOCYTES UA: NEGATIVE
NITRITE UA: NEGATIVE
PH UA: 6.5 (ref 5.0–8.0)
Protein, UA: NEGATIVE
Spec Grav, UA: 1.01 (ref 1.010–1.025)
Urobilinogen, UA: 0.2 E.U./dL

## 2016-09-04 NOTE — Progress Notes (Signed)
Chief Complaint  Patient presents with  . Dysuria    urine odor    Laura Dougherty is a 46 y.o. female here for possible UTI.  Duration: 2 months. Symptoms: some burning, odor, lower abd pressure Denies: urinary frequency, hematuria, urinary retention, fever, nausea and vomiting, vaginal discharge Hx of recurrent UTI? No Denies new sexual partners. She has been having prolonged symptoms and has been using colloidal silver that has provided some relief. She has also been using probiotics as she has been on antibiotics a lot lately. Antibiotics help but symptoms return when her course is complete. BM's are on the looser side. She has an appt with urology in 2 weeks.   ROS:  Constitutional: denies fever GU: As noted in HPI MSK: Deniesacute back pain Abd: Denies constipation or abdominal pain  Past Medical History:  Diagnosis Date  . Anxiety and depression    Hx of  . Colon polyps 2014   Benign  . Environmental allergies   . GERD (gastroesophageal reflux disease)   . History of chicken pox   . Migraines   . PTSD (post-traumatic stress disorder)   . Seasonal allergies   . Urine incontinence    Vaginal sling approx 12 yrs ago   Family History  Problem Relation Age of Onset  . Adopted: Yes  . Mental illness Mother        Manic Depression  . Alcoholism Father   . Stroke Sister        #1 38  . Clotting disorder Sister        #1  . Celiac disease Sister        59  . Raynaud syndrome Sister        #1  . Colitis Sister        Half  . Healthy Brother        Half  . Stroke Brother        Half  . Ovarian cancer Maternal Aunt   . Cervical cancer Maternal Aunt   . Learning disabilities Son        #1  . Autism Neg Hx        #2  . Anxiety disorder Neg Hx        #3   Social History   Social History  . Marital status: Divorced   Social History Main Topics  . Smoking status: Never Smoker  . Smokeless tobacco: Never Used  . Alcohol use Yes     Comment: rare   . Drug use: No     Comment: CBD oil     BP 104/70 (BP Location: Left Arm, Patient Position: Sitting, Cuff Size: Normal)   Pulse 87   Temp 98.7 F (37.1 C) (Oral)   Ht 5\' 6"  (1.676 m)   Wt 165 lb (74.8 kg)   SpO2 99%   BMI 26.63 kg/m  General: Awake, alert, appears stated age HEENT: MMM Heart: RRR, no murmurs Lungs: CTAB, normal respiratory effort, no accessory muscle usage Abd: BS+, soft, mild suprapubic TTP, ND, no masses or organomegaly MSK: No CVA tenderness, neg Lloyd's sign Psych: Age appropriate judgment and insight  Urinary disorder  Orders as above. UA neg. Pyridium suggested. Will culture, f/u with urology. Consider pelvic floor dysfunction or IC if she returns to me. F/u prn. The patient voiced understanding and agreement to the plan.  [place], DO 09/04/16 2:23 PM

## 2016-09-04 NOTE — Addendum Note (Signed)
Addended by: Sharon Seller B on: 09/04/2016 02:29 PM   Modules accepted: Orders

## 2016-09-04 NOTE — Progress Notes (Signed)
Pre visit review using our clinic review tool, if applicable. No additional management support is needed unless otherwise documented below in the visit note. 

## 2016-09-04 NOTE — Patient Instructions (Signed)
Vaginal Probiotic- RePhresh- give it a try.   I will let you know on MyChart your culture results over the weekend.  Make sure to remember to tell your urologist about your sling.   Let us know if you need anything.

## 2016-09-05 ENCOUNTER — Encounter: Payer: Self-pay | Admitting: Family Medicine

## 2016-09-06 ENCOUNTER — Encounter: Payer: Self-pay | Admitting: Family Medicine

## 2016-09-06 ENCOUNTER — Other Ambulatory Visit: Payer: Self-pay | Admitting: Family Medicine

## 2016-09-06 LAB — URINE CULTURE

## 2016-09-06 MED ORDER — NITROFURANTOIN MONOHYD MACRO 100 MG PO CAPS: 100.0000 mg | ORAL_CAPSULE | Freq: Two times a day (BID) | ORAL | 0 refills | Status: DC

## 2016-09-06 NOTE — Progress Notes (Signed)
Abx called in with positive culture result. Pt notified via [facility].

## 2016-09-08 ENCOUNTER — Other Ambulatory Visit: Payer: Self-pay | Admitting: Family Medicine

## 2016-09-08 MED ORDER — CEPHALEXIN 500 MG PO CAPS
500.0000 mg | ORAL_CAPSULE | Freq: Two times a day (BID) | ORAL | 0 refills | Status: DC
Start: 2016-09-08 — End: 2016-12-04

## 2016-09-08 NOTE — Progress Notes (Signed)
Macrobid D/C'd, Keflex called in.

## 2016-09-30 ENCOUNTER — Other Ambulatory Visit: Payer: Self-pay | Admitting: Family Medicine

## 2016-09-30 ENCOUNTER — Encounter: Payer: Self-pay | Admitting: Family Medicine

## 2016-09-30 ENCOUNTER — Ambulatory Visit (INDEPENDENT_AMBULATORY_CARE_PROVIDER_SITE_OTHER): Payer: BLUE CROSS/BLUE SHIELD | Admitting: Family Medicine

## 2016-09-30 VITALS — BP 118/82 | HR 75 | Temp 98.5°F | Ht 67.5 in | Wt 162.0 lb

## 2016-09-30 DIAGNOSIS — M2550 Pain in unspecified joint: Secondary | ICD-10-CM

## 2016-09-30 DIAGNOSIS — R740 Nonspecific elevation of levels of transaminase and lactic acid dehydrogenase [LDH]: Principal | ICD-10-CM

## 2016-09-30 DIAGNOSIS — S46811A Strain of other muscles, fascia and tendons at shoulder and upper arm level, right arm, initial encounter: Secondary | ICD-10-CM

## 2016-09-30 DIAGNOSIS — Z789 Other specified health status: Secondary | ICD-10-CM | POA: Diagnosis not present

## 2016-09-30 DIAGNOSIS — L659 Nonscarring hair loss, unspecified: Secondary | ICD-10-CM | POA: Diagnosis not present

## 2016-09-30 DIAGNOSIS — M25511 Pain in right shoulder: Secondary | ICD-10-CM | POA: Diagnosis not present

## 2016-09-30 DIAGNOSIS — R7401 Elevation of levels of liver transaminase levels: Secondary | ICD-10-CM

## 2016-09-30 DIAGNOSIS — R5383 Other fatigue: Secondary | ICD-10-CM | POA: Diagnosis not present

## 2016-09-30 LAB — COMPREHENSIVE METABOLIC PANEL
ALT: 42 U/L — ABNORMAL HIGH (ref 0–35)
AST: 26 U/L (ref 0–37)
Albumin: 4.3 g/dL (ref 3.5–5.2)
Alkaline Phosphatase: 67 U/L (ref 39–117)
BILIRUBIN TOTAL: 0.7 mg/dL (ref 0.2–1.2)
BUN: 15 mg/dL (ref 6–23)
CALCIUM: 9.5 mg/dL (ref 8.4–10.5)
CHLORIDE: 102 meq/L (ref 96–112)
CO2: 29 meq/L (ref 19–32)
Creatinine, Ser: 0.76 mg/dL (ref 0.40–1.20)
GFR: 87.16 mL/min (ref >60.00)
GLUCOSE: 94 mg/dL (ref 70–99)
POTASSIUM: 4.2 meq/L (ref 3.5–5.1)
Sodium: 137 mEq/L (ref 135–145)
Total Protein: 7.2 g/dL (ref 6.0–8.3)

## 2016-09-30 LAB — CBC
HCT: 41.9 % (ref 36.0–46.0)
HEMOGLOBIN: 14.1 g/dL (ref 12.0–15.0)
MCHC: 33.6 g/dL (ref 30.0–36.0)
MCV: 89.1 fl (ref 78.0–100.0)
PLATELETS: 333 10*3/uL (ref 150.0–400.0)
RBC: 4.7 Mil/uL (ref 3.87–5.11)
RDW: 12.2 % (ref 11.5–15.5)
WBC: 6 10*3/uL (ref 4.0–10.5)

## 2016-09-30 LAB — TSH: TSH: 0.86 u[IU]/mL (ref 0.35–4.50)

## 2016-09-30 LAB — VITAMIN D 25 HYDROXY (VIT D DEFICIENCY, FRACTURES): VITD: 39.63 ng/mL (ref 30.00–100.00)

## 2016-09-30 LAB — IBC PANEL
Iron: 125 ug/dL (ref 42–145)
SATURATION RATIOS: 30 % (ref 20.0–50.0)
Transferrin: 298 mg/dL (ref 212.0–360.0)

## 2016-09-30 LAB — FERRITIN: Ferritin: 32 ng/mL (ref 10.0–291.0)

## 2016-09-30 LAB — VITAMIN B12: VITAMIN B 12: 1134 pg/mL — AB (ref 211–911)

## 2016-09-30 NOTE — Patient Instructions (Signed)
Continue to use ice and heat.   Continue to find out which foods set you off.  Give [place] 2-3 business days to get the results of your labs back.   [facility] Ask your health care provider which exercises are safe for you. Do exercises exactly as told by your health care provider and adjust them as directed. It is normal to feel mild stretching, pulling, tightness, or discomfort as you do these exercises, but you should stop right away if you feel sudden pain or your pain gets worse.Do not begin these exercises until told by your health care provider. Stretching and range of motion exercises These exercises warm up your muscles and joints and improve the movement and flexibility of your shoulder. These exercises can also help to relieve pain, numbness, and tingling. If you are unable to do any of the following for any reason, do not further attempt to do it.  Exercise A: Flexion, standing    1. Stand and hold a broomstick, a cane, or a similar object. Place your hands a little more than shoulder-width apart on the object. Your left / right hand should be palm-up, and your other hand should be palm-down. 2. Push the stick to raise your left / right arm out to your side and then over your head. Use your other hand to help move the stick. Stop when you feel a stretch in your shoulder, or when you reach the angle that is recommended by your health care provider. ? Avoid shrugging your shoulder while you raise your arm. Keep your shoulder blade tucked down toward your spine. 3. Hold for 10-15 seconds. 4. Slowly return to the starting position. Repeat 2-3 times. Complete this exercise 1 time a day.  Exercise B: Abduction, supine    1. Lie on your back and hold a broomstick, a cane, or a similar object. Place your hands a little more than shoulder-width apart on the object. Your left / right hand should be palm-up, and your other hand should be palm-down. 2. Push the stick to raise your  left / right arm out to your side and then over your head. Use your other hand to help move the stick. Stop when you feel a stretch in your shoulder, or when you reach the angle that is recommended by your health care provider. ? Avoid shrugging your shoulder while you raise your arm. Keep your shoulder blade tucked down toward your spine. 3. Hold for 10-15 seconds. 4. Slowly return to the starting position. Repeat 2-3 times. Complete this exercise 1 time  a day.  Exercise C: Flexion, active-assisted    1. Lie on your back. You may bend your knees for comfort. 2. Hold a broomstick, a cane, or a similar object. Place your hands about shoulder-width apart on the object. Your palms should face toward your feet. 3. Raise the stick and move your arms over your head and behind your head, toward the floor. Use your healthy arm to help your left / right arm move farther. Stop when you feel a gentle stretch in your shoulder, or when you reach the angle where your health care provider tells you to stop. 4. Hold for 10-15 seconds. 5. Slowly return to the starting position. Repeat 2-3 times. Complete this exercise 1 time  a day.  Exercise D: External rotation and abduction    1. Stand in a door frame with one of your feet slightly in front of the other. This is called a  staggered stance. 2. Choose one of the following positions as told by your health care provider: ? Place your hands and forearms on the door frame above your head. ? Place your hands and forearms on the door frame at the height of your head. ? Place your hands on the door frame at the height of your elbows. 3. Slowly move your weight onto your front foot until you feel a stretch across your chest and in the front of your shoulders. Keep your head and chest upright and keep your abdominal muscles tight. 4. Hold for 10-15 seconds. 5. To release the stretch, shift your weight to your back foot. Repeat 2-3 times. Complete this stretch 1  time  a day.  Strengthening exercises These exercises build strength and endurance in your shoulder. Endurance is the ability to use your muscles for a long time, even after your muscles get tired. Exercise E: Scapular depression and adduction  1. Sit on a stable chair. Support your arms in front of you with pillows, armrests, or a tabletop. Keep your elbows in line with the sides of your body. 2. Gently move your shoulder blades down toward your middle back. Relax the muscles on the tops of your shoulders and in the back of your neck. 3. Hold for 10-15 seconds. 4. Slowly release the tension and relax your muscles completely before doing this exercise again. 5. After you have practiced this exercise, try doing the exercise without the arm support. Then, try the exercise while standing instead of sitting. Repeat 2-3 times. Complete this exercise 1 time  a day.  Exercise F: Shoulder abduction, isometric    1. Stand or sit about 4-6 inches (10-15 cm) from a wall with your left / right side facing the wall. 2. Bend your left / right elbow and gently press your elbow against the wall. 3. Increase the pressure slowly until you are pressing as hard as you can without shrugging your shoulder. 4. Hold for 10-15 seconds. 5. Slowly release the tension and relax your muscles completely. Repeat 2-3 times. Complete this exercise 1 time  a day.  Exercise G: Shoulder flexion, isometric    1. Stand or sit about 4-6 inches (10-15 cm) away from a wall with your left / right side facing the wall. 2. Keep your left / right elbow straight and gently press the top of your fist against the wall. Increase the pressure slowly until you are pressing as hard as you can without shrugging your shoulder. 3. Hold for 10-15 seconds. 4. Slowly release the tension and relax your muscles completely. Repeat 2-3 times. Complete this exercise 1 time  a day.  Exercise H: Internal rotation    1. Sit in a stable chair  without armrests, or stand. Secure an exercise band at your left / right side, at elbow height. 2. Place a soft object, such as a folded towel or a small pillow, under your left / right upper arm so your elbow is a few inches (about 8 cm) away from your side. 3. Hold the end of the exercise band so the band stretches. 4. Keeping your elbow pressed against the soft object under your arm, move your forearm across your body toward your abdomen. Keep your body steady so the movement is only coming from your shoulder. 5. Hold for 10-15 seconds. 6. Slowly return to the starting position. Repeat 2-3 times. Complete this exercise 1 time  a day.  Exercise I: External rotation    1.  Sit in a stable chair without armrests, or stand. 2. Secure an exercise band at your left / right side, at elbow height. 3. Place a soft object, such as a folded towel or a small pillow, under your left / right upper arm so your elbow is a few inches (about 8 cm) away from your side. 4. Hold the end of the exercise band so the band stretches. 5. Keeping your elbow pressed against the soft object under your arm, move your forearm out, away from your abdomen. Keep your body steady so the movement is only coming from your shoulder. 6. Hold for 10-15 seconds. 7. Slowly return to the starting position. Repeat 2-3 times. Complete this exercise 1 time  a day. Exercise J: Shoulder extension  1. Sit in a stable chair without armrests, or stand. Secure an exercise band to a stable object in front of you so the band is at shoulder height. 2. Hold one end of the exercise band in each hand. Your palms should face each other. 3. Straighten your elbows and lift your hands up to shoulder height. 4. Step back, away from the secured end of the exercise band, until the band stretches. 5. Squeeze your shoulder blades together and pull your hands down to the sides of your thighs. Stop when your hands are straight down by your sides. Do not let  your hands go behind your body. 6. Hold for 10-15 seconds. 7. Slowly return to the starting position. Repeat 2-3 times. Complete this exercise 1 time  a day.  Exercise K: Shoulder extension, prone    1. Lie on your abdomen on a firm surface so your left / right arm hangs over the edge. 2. Hold a 5 lb weight in your hand so your palm faces in toward your body. Your arm should be straight. 3. Squeeze your shoulder blade down toward the middle of your back. 4. Slowly raise your arm behind you, up to the height of the surface that you are lying on. Keep your arm straight. 5. Hold for 10-15 seconds. 6. Slowly return to the starting position and relax your muscles. Repeat 2-3 times. Complete this exercise 1 time  a day.  Exercise L: Horizontal abduction, prone  1. Lie on your abdomen on a firm surface so your left / right arm hangs over the edge. 2. Hold a 5 lb weight in your hand so your palm faces toward your feet. Your arm should be straight. 3. Squeeze your shoulder blade down toward the middle of your back. 4. Bend your elbow so your hand moves up, until your elbow is bent to an "L" shape (90 degrees). With your elbow bent, slowly move your forearm forward and up. Raise your hand up to the height of the surface that you are lying on. ? Your upper arm should not move, and your elbow should stay bent. ? At the top of the movement, your palm should face the floor. 5. Hold for 10-15 seconds. 6. Slowly return to the starting position and relax your muscles. Repeat 2-3 times. Complete this exercise 1 time a day.  Exercise M: Horizontal abduction, standing  1. Sit on a stable chair, or stand. 2. Secure an exercise band to a stable object in front of you so the band is at shoulder height. 3. Hold one end of the exercise band in each hand. 4. Straighten your elbows and lift your hands straight in front of you, up to shoulder height. Your palms should  face down, toward the floor. 5. Step back,  away from the secured end of the exercise band, until the band stretches. 6. Move your arms out to your sides, and keep your arms straight. 7. Hold for 10-15 seconds. 8. Slowly return to the starting position. Repeat 2-3 times. Complete this exercise 1 time a day.  Exercise N: Scapular retraction and elevation  1. Sit on a stable chair, or stand. 2. Secure an exercise band to a stable object in front of you so the band is at shoulder height. 3. Hold one end of the exercise band in each hand. Your palms should face each other. 4. Sit in a stable chair without armrests, or stand. 5. Step back, away from the secured end of the exercise band, until the band stretches. 6. Squeeze your shoulder blades together and lift your hands over your head. Keep your elbows straight. 7. Hold for 10-15 seconds. 8. Slowly return to the starting position. Repeat 3-4 times. Complete this exercise 1-2 times a day.  This information is not intended to replace advice given to you by your health care provider. Make sure you discuss any questions you have with your health care provider. Document Released: 12/22/2004 Document Revised: 08/29/2015 Document Reviewed: 11/08/2014 Elsevier Interactive Patient Education  2017 [place] (ROM) AND STRETCHING EXERCISES These exercises may help you when beginning to rehabilitate your injury. Your symptoms may resolve with or without further involvement from your physician, physical therapist or athletic trainer. While completing these exercises, remember:   Restoring tissue flexibility helps normal motion to return to the joints. This allows healthier, less painful movement and activity.  An effective stretch should be held for at least 30 seconds.  A stretch should never be painful. You should only feel a gentle lengthening or release in the stretched tissue.  ROM - Pendulum  Bend at the waist so that your right / left arm falls away from  your body. Support yourself with your opposite hand on a solid surface, such as a table or a countertop.  Your right / left arm should be perpendicular to the ground. If it is not perpendicular, you need to lean over farther. Relax the muscles in your right / left arm and shoulder as much as possible.  Gently sway your hips and trunk so they move your right / left arm without any use of your right / left shoulder muscles.  Progress your movements so that your right / left arm moves side to side, then forward and backward, and finally, both clockwise and counterclockwise.  Complete 10-15 repetitions in each direction. Many people use this exercise to relieve discomfort in their shoulder as well as to gain range of motion. Repeat 2-3 times. Complete this exercise once per day.  STRETCH - Flexion, Standing  Stand with good posture. With an underhand grip on your right / left hand and an overhand grip on the opposite hand, grasp a broomstick or cane so that your hands are a little more than shoulder-width apart.  Keeping your right / left elbow straight and shoulder muscles relaxed, push the stick with your opposite hand to raise your right / left arm in front of your body and then overhead. Raise your arm until you feel a stretch in your right / left shoulder, but before you have increased shoulder pain.  Try to avoid shrugging your right / left shoulder as your arm rises by keeping your shoulder blade tucked down  and toward your mid-back spine. Hold 15-20 seconds.  Slowly return to the starting position. Repeat 2-3 times. Complete this exercise once per day.  STRETCH - Internal Rotation  Place your right / left hand behind your back, palm-up.  Throw a towel or belt over your opposite shoulder. Grasp the towel/belt with your right / left hand.  While keeping an upright posture, gently pull up on the towel/belt until you feel a stretch in the front of your right / left shoulder.  Avoid  shrugging your right / left shoulder as your arm rises by keeping your shoulder blade tucked down and toward your mid-back spine.  Hold 15-20. Release the stretch by lowering your opposite hand. Repeat 2-3 times. Complete this exercise once per day.  STRETCH - External Rotation and Abduction  Stagger your stance through a doorframe. It does not matter which foot is forward.  As instructed by your physician, physical therapist or athletic trainer, place your hands: ? And forearms above your head and on the door frame. ? And forearms at head-height and on the door frame. ? At elbow-height and on the door frame.  Keeping your head and chest upright and your stomach muscles tight to prevent over-extending your low-back, slowly shift your weight onto your front foot until you feel a stretch across your chest and/or in the front of your shoulders.  Hold 15-20 seconds. Shift your weight to your back foot to release the stretch. Repeat 2-3 times. Complete this stretch once per day.   STRENGTHENING EXERCISES  These exercises may help you when beginning to rehabilitate your injury. They may resolve your symptoms with or without further involvement from your physician, physical therapist or athletic trainer. While completing these exercises, remember:   Muscles can gain both the endurance and the strength needed for everyday activities through controlled exercises.  Complete these exercises as instructed by your physician, physical therapist or athletic trainer. Progress the resistance and repetitions only as guided.  You may experience muscle soreness or fatigue, but the pain or discomfort you are trying to eliminate should never worsen during these exercises. If this pain does worsen, stop and make certain you are following the directions exactly. If the pain is still present after adjustments, discontinue the exercise until you can discuss the trouble with your clinician.  If advised by your  physician, during your recovery, avoid activity or exercises which involve actions that place your right / left hand or elbow above your head or behind your back or head. These positions stress the tissues which are trying to heal.  STRENGTH - Scapular Depression and Adduction  With good posture, sit on a firm chair. Supported your arms in front of you with pillows, arm rests or a table top. Have your elbows in line with the sides of your body.  Gently draw your shoulder blades down and toward your mid-back spine. Gradually increase the tension without tensing the muscles along the top of your shoulders and the back of your neck.  Hold for 5 seconds. Slowly release the tension and relax your muscles completely before completing the next repetition.  After you have practiced this exercise, remove the arm support and complete it in standing as well as sitting. Repeat 2 times. Complete this exercise every other day.   STRENGTH - External Rotators  Secure a rubber exercise band/tubing to a fixed object so that it is at the same height as your right / left elbow when you are standing or sitting  on a firm surface.  Stand or sit so that the secured exercise band/tubing is at your side that is not injured.  Bend your elbow 90 degrees. Place a folded towel or small pillow under your right / left arm so that your elbow is a few inches away from your side.  Keeping the tension on the exercise band/tubing, pull it away from your body, as if pivoting on your elbow. Be sure to keep your body steady so that the movement is only coming from your shoulder rotating.  Hold 5 seconds. Release the tension in a controlled manner as you return to the starting position. Repeat 2 times. Complete this exercise every other day.   STRENGTH - Supraspinatus  Stand or sit with good posture. Grasp a 2-3 lb weight or an exercise band/tubing so that your hand is "thumbs-up," like when you shake hands.  Slowly lift your  right / left hand from your thigh into the air, traveling about 30 degrees from straight out at your side. Lift your hand to shoulder height or as far as you can without increasing any shoulder pain. Initially, many people do not lift their hands above shoulder height.  Avoid shrugging your right / left shoulder as your arm rises by keeping your shoulder blade tucked down and toward your mid-back spine.  Hold for 4-5 seconds. Control the descent of your hand as you slowly return to your starting position. Repeat 2 times. Complete this exercise every other day.   STRENGTH - Shoulder Extensors  Secure a rubber exercise band/tubing so that it is at the height of your shoulders when you are either standing or sitting on a firm arm-less chair.  With a thumbs-up grip, grasp an end of the band/tubing in each hand. Straighten your elbows and lift your hands straight in front of you at shoulder height. Step back away from the secured end of band/tubing until it becomes tense.  Squeezing your shoulder blades together, pull your hands down to the sides of your thighs. Do not allow your hands to go behind you.  Hold for 5 seconds. Slowly ease the tension on the band/tubing as you reverse the directions and return to the starting position. Repeat 2 times. Complete this exercise every other day.   STRENGTH - Scapular Retractors  Secure a rubber exercise band/tubing so that it is at the height of your shoulders when you are either standing or sitting on a firm arm-less chair.  With a palm-down grip, grasp an end of the band/tubing in each hand. Straighten your elbows and lift your hands straight in front of you at shoulder height. Step back away from the secured end of band/tubing until it becomes tense.  Squeezing your shoulder blades together, draw your elbows back as you bend them. Keep your upper arm lifted away from your body throughout the exercise.  Hold 5 seconds. Slowly ease the tension on the  band/tubing as you reverse the directions and return to the starting position. Repeat 2 times. Complete this exercise every other day.  STRENGTH - Scapular Depressors  Find a sturdy chair without wheels, such as a from a dining room table.  Keeping your feet on the floor, lift your bottom from the seat and lock your elbows.  Keeping your elbows straight, allow gravity to pull your body weight down. Your shoulders will rise toward your ears.  Raise your body against gravity by drawing your shoulder blades down your back, shortening the distance between your shoulders and  ears. Although your feet should always maintain contact with the floor, your feet should progressively support less body weight as you get stronger.  Hold 5 seconds. In a controlled and slow manner, lower your body weight to begin the next repetition. Repeat 2 times. Complete this exercise every other day.   This information is not intended to replace advice given to you by your health care provider. Make sure you discuss any questions you have with your health care provider.  Document Released: 11/05/2004 Document Revised: 01/12/2014 Document Reviewed: 04/05/2008 Elsevier Interactive Patient Education [employer].

## 2016-09-30 NOTE — Progress Notes (Signed)
Musculoskeletal Exam  Patient: Laura Dougherty DOB: 1970-10-18  DOS: 09/30/2016  SUBJECTIVE:  Chief Complaint:   Chief Complaint  Patient presents with  . Follow-up    Laura Dougherty is a 46 y.o.  female for evaluation and treatment of R shoulder pain.   Onset:  7 weeks ago. Unsure exact time it started, she had been lifting weights more often.  Location: Upper pec, trap and shoulder region Character:  aching and burning  Progression of issue:  Reports around 60% improvement Associated symptoms: decreased ROM Treatment: to date has been rest, OTC NSAIDS and heat.   Neurovascular symptoms: no  She is a vegetarian and would like B12 levels checked. She also has hx of Vit D def, currently on supplementation, recently changed and would like to see if this has changed things. Has been having diffuse pain and hair loss in addition to fatigue.  ROS: Musculoskeletal/Extremities: +R shoulder pain Neurologic: no numbness, tingling no weakness   Past Medical History:  Diagnosis Date  . Anxiety and depression    Hx of  . Colon polyps 2014   Benign  . Environmental allergies   . GERD (gastroesophageal reflux disease)   . History of chicken pox   . Migraines   . PTSD (post-traumatic stress disorder)   . Seasonal allergies   . Urine incontinence    Vaginal sling approx 12 yrs ago   Past Surgical History:  Procedure Laterality Date  . CHOLECYSTECTOMY  2008  . COLONOSCOPY    . ENDOMETRIAL ABLATION  ~2011   [facility]  . ESOPHAGOGASTRODUODENOSCOPY ENDOSCOPY    . TONSILLECTOMY  20 years ago  . TUBAL LIGATION    . WISDOM TOOTH EXTRACTION     Family History  Problem Relation Age of Onset  . Adopted: Yes  . Mental illness Mother        Manic Depression  . Alcoholism Father   . Stroke Sister        #1 61  . Clotting disorder Sister        #1  . Celiac disease Sister        66  . Raynaud syndrome Sister        #1  . Colitis Sister        Half  . Healthy  Brother        Half  . Stroke Brother        Half  . Ovarian cancer Maternal Aunt   . Cervical cancer Maternal Aunt   . Learning disabilities Son        #1  . Autism Neg Hx        #2  . Anxiety disorder Neg Hx        #3   Current Outpatient Prescriptions  Medication Sig Dispense Refill  . acyclovir (ZOVIRAX) 400 MG tablet Take 1 tablet (400 mg total) by mouth 3 (three) times daily. Take for 5 days. (Patient taking differently: Take 400 mg by mouth 3 (three) times daily. Take for 5 days. ) 15 tablet 0  . ALPRAZolam (XANAX) 0.5 MG tablet Take 1 tablet (0.5 mg total) by mouth at bedtime as needed for anxiety. 30 tablet 1  . Ascorbic Acid (VITAMIN C PO) Take 1,500 mg by mouth daily.    Marland Kitchen b complex vitamins tablet Take 1 tablet by mouth as needed.    . Bromelains (BROMELAIN PO) Take by mouth as needed.    . cephALEXin (KEFLEX) 500 MG capsule Take 1 capsule (500 mg  total) by mouth 2 (two) times daily. 20 capsule 0  . Cholecalciferol (VITAMIN D PO) Take 5,000 Units by mouth 2 (two) times daily.     . cyclobenzaprine (FLEXERIL) 10 MG tablet Take 10 mg by mouth as needed for muscle spasms.    . famotidine (PEPCID) 40 MG tablet Take 40 mg by mouth daily.    . metoCLOPramide (REGLAN) 10 MG tablet Take 10 mg by mouth 3 (three) times daily as needed.     Marland Kitchen OVER THE COUNTER MEDICATION     . OVER THE COUNTER MEDICATION Digestive Aid    . Probiotic Product (PROBIOTIC DAILY PO) Take 1 capsule by mouth daily.    . rizatriptan (MAXALT) 10 MG tablet Take 1 tablet (10 mg total) by mouth once. May repeat in 2 hours if needed 10 tablet 0   Allergies  Allergen Reactions  . Gluten Meal Nausea And Vomiting  . Kelp [Iodine] Rash  . Levofloxacin Rash  . Sulfamethoxazole-Trimethoprim Rash    allergic to Bactrim    Social History   Social History  . Marital status: Divorced   Social History Main Topics  . Smoking status: Never Smoker  . Smokeless tobacco: Never Used  . Alcohol use Yes     Comment:  rare  . Drug use: No     Comment: CBD oil    Objective: VITAL SIGNS: BP 118/82 (BP Location: Left Arm, Patient Position: Sitting, Cuff Size: Normal)   Pulse 75   Temp 98.5 F (36.9 C) (Oral)   Ht 5' 7.5" (1.715 m)   Wt 162 lb (73.5 kg)   SpO2 97%   BMI 25.00 kg/m  Constitutional: Well formed, well developed. No acute distress. Thorax & Lungs: No accessory muscle use Extremities: No clubbing. No cyanosis. No edema.  Skin: Warm. Dry. No erythema. No rash.  Musculoskeletal: R shoulder.   Normal active range of motion: no.   Normal passive range of motion: yes Tenderness to palpation: yes- upper pec, trapezius Deformity: no Ecchymosis: no Tests positive: Lift off Tests negative: Spurling's, Neer's, Hawkins, Empty-can, cross over, Obrien's, Speed's Neurologic: Normal sensory function. No focal deficits noted. DTR's equal and symmetry in UE's. No clonus. Psychiatric: Normal mood. Age appropriate judgment and insight. Alert & oriented x 3.    Assessment:  Trapezius muscle strain, right, initial encounter  Right shoulder pain, unspecified chronicity  Hair loss - Plan: CBC, IBC panel, Ferritin, Comprehensive metabolic panel, TSH, Vitamin D (25 hydroxy)  Fatigue, unspecified type - Plan: Comprehensive metabolic panel  Arthralgia, unspecified joint - Plan: Vitamin D (25 hydroxy)  Vegetarian diet - Plan: Vitamin B12  Plan: Orders as above. Cont heat and NSAIDs, sounds like she is improving. Home stretches/exercises given. Avoid aggravating activities. F/u if no improvement/resolution. F/u on some labs with various complaints. F/u pending results.  The patient voiced understanding and agreement to the plan.   [place], DO 09/30/16  8:02 AM

## 2016-09-30 NOTE — Progress Notes (Signed)
F/u labs ordered per result note.

## 2016-09-30 NOTE — Progress Notes (Signed)
Pre visit review using our clinic review tool, if applicable. No additional management support is needed unless otherwise documented below in the visit note. 

## 2016-10-01 ENCOUNTER — Other Ambulatory Visit: Payer: Self-pay | Admitting: Family Medicine

## 2016-10-01 DIAGNOSIS — R748 Abnormal levels of other serum enzymes: Secondary | ICD-10-CM

## 2016-10-01 NOTE — Progress Notes (Signed)
B12 to be done in 3 mo, ordered. Other hepatic labs sooner. Pt to be notified.

## 2016-10-02 ENCOUNTER — Other Ambulatory Visit (INDEPENDENT_AMBULATORY_CARE_PROVIDER_SITE_OTHER): Payer: BLUE CROSS/BLUE SHIELD

## 2016-10-02 DIAGNOSIS — R7401 Elevation of levels of liver transaminase levels: Secondary | ICD-10-CM

## 2016-10-02 DIAGNOSIS — R748 Abnormal levels of other serum enzymes: Secondary | ICD-10-CM

## 2016-10-02 DIAGNOSIS — R740 Nonspecific elevation of levels of transaminase and lactic acid dehydrogenase [LDH]: Secondary | ICD-10-CM | POA: Diagnosis not present

## 2016-10-02 NOTE — Addendum Note (Signed)
Addended by: Caffie Pinto on: 10/02/2016 02:27 PM   Modules accepted: Orders

## 2016-10-02 NOTE — Addendum Note (Signed)
Addended by: Caffie Pinto on: 10/02/2016 02:26 PM   Modules accepted: Orders

## 2016-10-03 LAB — HEPATIC FUNCTION PANEL
AG RATIO: 1.6 (calc) (ref 1.0–2.5)
ALKALINE PHOSPHATASE (APISO): 68 U/L (ref 33–115)
ALT: 36 U/L — AB (ref 6–29)
AST: 23 U/L (ref 10–35)
Albumin: 4 g/dL (ref 3.6–5.1)
Bilirubin, Direct: 0.1 mg/dL (ref 0.0–0.2)
Globulin: 2.5 g/dL (calc) (ref 1.9–3.7)
Indirect Bilirubin: 0.4 mg/dL (calc) (ref 0.2–1.2)
TOTAL PROTEIN: 6.5 g/dL (ref 6.1–8.1)
Total Bilirubin: 0.5 mg/dL (ref 0.2–1.2)

## 2016-10-03 LAB — HEPATITIS C ANTIBODY
Hepatitis C Ab: NONREACTIVE
SIGNAL TO CUT-OFF: 0.01 (ref <1.00)

## 2016-10-03 LAB — VITAMIN B12: Vitamin B-12: 1145 pg/mL — ABNORMAL HIGH (ref 200–1100)

## 2016-10-03 LAB — HEPATITIS B SURFACE ANTIGEN: Hepatitis B Surface Ag: NONREACTIVE

## 2016-10-05 ENCOUNTER — Other Ambulatory Visit: Payer: BLUE CROSS/BLUE SHIELD

## 2016-10-05 ENCOUNTER — Other Ambulatory Visit: Payer: Self-pay | Admitting: Physician Assistant

## 2016-10-05 DIAGNOSIS — R748 Abnormal levels of other serum enzymes: Secondary | ICD-10-CM

## 2016-10-05 DIAGNOSIS — R7401 Elevation of levels of liver transaminase levels: Secondary | ICD-10-CM

## 2016-10-05 DIAGNOSIS — R740 Nonspecific elevation of levels of transaminase and lactic acid dehydrogenase [LDH]: Principal | ICD-10-CM

## 2016-10-07 ENCOUNTER — Encounter: Payer: Self-pay | Admitting: Neurology

## 2016-10-07 ENCOUNTER — Ambulatory Visit (INDEPENDENT_AMBULATORY_CARE_PROVIDER_SITE_OTHER): Payer: BLUE CROSS/BLUE SHIELD | Admitting: Neurology

## 2016-10-07 VITALS — BP 114/78 | HR 83 | Ht 68.0 in | Wt 157.0 lb

## 2016-10-07 DIAGNOSIS — R519 Headache, unspecified: Secondary | ICD-10-CM

## 2016-10-07 DIAGNOSIS — R51 Headache: Secondary | ICD-10-CM | POA: Diagnosis not present

## 2016-10-07 DIAGNOSIS — R413 Other amnesia: Secondary | ICD-10-CM

## 2016-10-07 DIAGNOSIS — R4789 Other speech disturbances: Secondary | ICD-10-CM | POA: Diagnosis not present

## 2016-10-07 NOTE — Patient Instructions (Addendum)
1. Schedule MRI brain without contrast  We have sent a referral to [facility] for your MRI and they will call you directly to schedule your appt. They are located at [place]. If you need to contact them directly please call [phone].   2. One day at a time, continue working with your therapist 3. Our office will call you with test results, if normal, continue follow-up with therapist and follow-up on as needed basis

## 2016-10-07 NOTE — Progress Notes (Signed)
NEUROLOGY CONSULTATION NOTE  Laura Dougherty MRN: [id number] DOB: 1970/04/05  Referring provider: Elyn Aquas, PA-C Primary care provider: Elyn Aquas, PA-C  Reason for consult:  Memory and speech changes  Thank you for your kind referral of Laura Dougherty for consultation of the above symptoms. Although her history is well known to you, please allow me to reiterate it for the purpose of our medical record. Records and images were personally reviewed where available.  HISTORY OF PRESENT ILLNESS: This is a 46 year old right-handed woman with a history of migraines, anxiety, depression, PTSD, presenting for evaluation of worsening memory. She started noticing memory changes after she started working with her boss in January 2016. She would get so upset in tears she could not get her words out. It reminded her of the PTSD from childhood that she had forgotten about. She lives with her sons ages 50, 63, and 57, and denies getting lost driving, no missed bills or medications. She mostly has problems speaking, occurring at any time. When she was talking to staff earlier, she would "smush" words together or the whole end of the word would change. She would be talking and words would be there but she cannot get them out. She feels the words are in her brain, but she looks like a "deer in the headlights." With multi-step conversations, she would forget halfway through what they were talking about. She notices it at work, she would ask if they made an appointment when she already asked the question. She would be going back to get a chart and cannot recall what she went to do. She would forget to eat, or would not recall if she ate. She feels a lot of symptoms also started when her husband took her to court, as well as with her boss. She feels there has been some improvement seeing a therapist. She reports her middle son has been diagnosed with memory issues, and her youngest son diagnosed with autism.  She denies any head injuries or alcohol use.  She has had frequent headaches/migraines since her divorce in 2010. She takes Maxalt prn. Frequency varies, but since the past 3 weeks, they have been occurring on a daily basis. Pain recently has but coming up and around behind her right cheek and eye, but sometimes in the frontal and occipital regions. When she is under severe stress, her whole head is humming, something it is a burning, squeezing, pressure sensation behind her right eye. She notices some relief at times with chiropractic adjustment, but other times it makes headache worse. She has also been dealing with shoulder and neck trauma with pain going up her neck since July 2018, and is working with her [employer] manager, fast food. She has nausea with migraines, but suffers from nausea separately as well. Aleve or Maxalt may or may not help sometimes. She denies any dizziness with the headaches, ut has been dizzy this week. She would have a headache is she missed a meal. There is no diplopia, dysarthria, bladder/bowel dysfunction, anosmia. She had her esophagus dilated but recently has had some difficulty swallowing again. She had a low back injury at work 1.5-2 years ago and has had numbness/tingling behind her right leg. She has stress-induced neck and shoulder trauma and cramps on her right lateral thigh. She has tremors when having a panic attack. She has a lot of shaking with triggers (5 episodes in the past 8 years). Her boss can trigger her, but she has been getting better with her response (shakes  on the inside, chattering of teeth, palpitations) after working with her therapist for the past 7-8 months.    Laboratory Data: Lab Results  Component Value Date   TSH 0.86 09/30/2016   Lab Results  Component Value Date   [id number] 7,619 (H) 10/02/2016     PAST MEDICAL HISTORY: Past Medical History:  Diagnosis Date  . Anxiety and depression    Hx of  . Colon polyps 2014   Benign  . Environmental  allergies   . GERD (gastroesophageal reflux disease)   . History of chicken pox   . Migraines   . PTSD (post-traumatic stress disorder)   . Seasonal allergies   . Urine incontinence    Vaginal sling approx 12 yrs ago    PAST SURGICAL HISTORY: Past Surgical History:  Procedure Laterality Date  . CHOLECYSTECTOMY  2008  . COLONOSCOPY    . ENDOMETRIAL ABLATION  ~2011   [facility]  . ESOPHAGOGASTRODUODENOSCOPY ENDOSCOPY    . TONSILLECTOMY  20 years ago  . TUBAL LIGATION    . WISDOM TOOTH EXTRACTION      MEDICATIONS: Current Outpatient Prescriptions on File Prior to Visit  Medication Sig Dispense Refill  . acyclovir (ZOVIRAX) 400 MG tablet Take 1 tablet (400 mg total) by mouth 3 (three) times daily. Take for 5 days. (Patient taking differently: Take 400 mg by mouth 3 (three) times daily. Take for 5 days. ) 15 tablet 0  . ALPRAZolam (XANAX) 0.5 MG tablet Take 1 tablet (0.5 mg total) by mouth at bedtime as needed for anxiety. 30 tablet 1  . Ascorbic Acid (VITAMIN C PO) Take 1,500 mg by mouth daily.    Marland Kitchen b complex vitamins tablet Take 1 tablet by mouth as needed.    . Bromelains (BROMELAIN PO) Take by mouth as needed.    . cephALEXin (KEFLEX) 500 MG capsule Take 1 capsule (500 mg total) by mouth 2 (two) times daily. 20 capsule 0  . Cholecalciferol (VITAMIN D PO) Take 5,000 Units by mouth 2 (two) times daily.     . cyclobenzaprine (FLEXERIL) 10 MG tablet Take 10 mg by mouth as needed for muscle spasms.    . famotidine (PEPCID) 40 MG tablet Take 40 mg by mouth daily.    . metoCLOPramide (REGLAN) 10 MG tablet Take 10 mg by mouth 3 (three) times daily as needed.     Marland Kitchen OVER THE COUNTER MEDICATION     . OVER THE COUNTER MEDICATION Digestive Aid    . Probiotic Product (PROBIOTIC DAILY PO) Take 1 capsule by mouth daily.     No current facility-administered medications on file prior to visit.     ALLERGIES: Allergies  Allergen Reactions  . Gluten Meal Nausea And Vomiting  . Kelp  [Iodine] Rash  . Levofloxacin Rash  . Sulfamethoxazole-Trimethoprim Rash    allergic to Bactrim     FAMILY HISTORY: Family History  Problem Relation Age of Onset  . Adopted: Yes  . Mental illness Mother        Manic Depression  . Alcoholism Father   . Stroke Sister        #1 55  . Clotting disorder Sister        #1  . Celiac disease Sister        58  . Raynaud syndrome Sister        #1  . Colitis Sister        Half  . Healthy Brother  Half  . Stroke Brother        Half  . Ovarian cancer Maternal Aunt   . Cervical cancer Maternal Aunt   . Learning disabilities Son        #1  . Autism Neg Hx        #2  . Anxiety disorder Neg Hx        #3    SOCIAL HISTORY: Social History   Social History  . Marital status: Divorced    Spouse name: N/A  . Number of children: N/A  . Years of education: N/A   Occupational History  . Not on file.   Social History Main Topics  . Smoking status: Never Smoker  . Smokeless tobacco: Never Used  . Alcohol use Yes     Comment: rare  . Drug use: No     Comment: CBD oil   . Sexual activity: Not on file   Other Topics Concern  . Not on file   Social History Narrative  . No narrative on file    REVIEW OF SYSTEMS: Constitutional: No fevers, chills, or sweats, no generalized fatigue, change in appetite Eyes: No visual changes, double vision, eye pain Ear, nose and throat: No hearing loss, ear pain, nasal congestion, sore throat Cardiovascular: No chest pain, palpitations Respiratory:  No shortness of breath at rest or with exertion, wheezes GastrointestinaI: No nausea, vomiting, diarrhea, abdominal pain, fecal incontinence Genitourinary:  No dysuria, urinary retention or frequency Musculoskeletal:  + neck pain, back pain Integumentary: No rash, pruritus, skin lesions Neurological: as above Psychiatric: + depression, insomnia, anxiety Endocrine: No palpitations, fatigue, diaphoresis, mood swings, change in appetite,  change in weight, increased thirst Hematologic/Lymphatic:  No anemia, purpura, petechiae. Allergic/Immunologic: no itchy/runny eyes, nasal congestion, recent allergic reactions, rashes  PHYSICAL EXAM: Vitals:   10/07/16 1349  BP: 114/78  Pulse: 83  SpO2: 98%   General: No acute distress, anxious, labile mood Head:  Normocephalic/atraumatic Eyes: Fundoscopic exam shows bilateral sharp discs, no vessel changes, exudates, or hemorrhages Neck: supple, no paraspinal tenderness, full range of motion Back: No paraspinal tenderness Heart: regular rate and rhythm Lungs: Clear to auscultation bilaterally. Vascular: No carotid bruits. Skin/Extremities: No rash, no edema Neurological Exam: Mental status: alert and oriented to person, place, and time, no dysarthria or aphasia, Fund of knowledge is appropriate.  Recent and remote memory are intact.  Attention and concentration are normal.    Able to name objects and repeat phrases. Able to give 14 words starting with F in 1 minute (normal > 11 words). CDT 5/5  MMSE - Mini Mental State Exam 10/12/2016  Orientation to time 5  Orientation to Place 5  Registration 3  Attention/ Calculation 5  Recall 2  Language- name 2 objects 2  Language- repeat 1  Language- follow 3 step command 3  Language- read & follow direction 1  Write a sentence 1  Copy design 1  Total score 29   Cranial nerves: CN I: not tested CN II: pupils equal, round and reactive to light, visual fields intact, fundi unremarkable. CN III, IV, VI:  full range of motion, no nystagmus, no ptosis CN V: facial sensation intact CN VII: upper and lower face symmetric CN VIII: hearing intact to finger rub CN IX, X: gag intact, uvula midline CN XI: sternocleidomastoid and trapezius muscles intact CN XII: tongue midline Bulk & Tone: normal, no fasciculations. Motor: 5/5 throughout with no pronator drift. Sensation: intact to light touch, cold, pin, vibration and  joint position sense.   No extinction to double simultaneous stimulation.  Romberg test negative Deep Tendon Reflexes: +2 throughout, no ankle clonus Plantar responses: downgoing bilaterally Cerebellar: no incoordination on finger to nose, heel to shin. No dysdiadochokinesia Gait: narrow-based and steady, able to tandem walk adequately. Tremor: none  IMPRESSION: This is a 46 year old right-handed woman with a history of migraines, anxiety, depression, PTSD, presenting for evaluation of word-finding difficulties and memory changes. She has also been noticing an increase in headaches, daily in the past 3 weeks. Her neurological exam is normal, MMSE normal 29/30. She is noted to be anxious in the office with labile mood. We discussed different causes of memory changes. TSH and B12 normal. We discussed how anxiety, depression, PTSD can cause memory changes. MRI brain without contrast will be ordered to assess for underlying structural abnormality. If normal, she will follow-up on a prn basis and was advised to continue follow-up with therapist and psychiatrist if needed.   Thank you for allowing me to participate in the care of this patient. Please do not hesitate to call for any questions or concerns.   Ellouise Newer, M.D.  CC: Dr. Nani Ravens, Sherald Barge, PA-C

## 2016-10-09 ENCOUNTER — Encounter: Payer: Self-pay | Admitting: Family Medicine

## 2016-10-09 ENCOUNTER — Other Ambulatory Visit: Payer: BLUE CROSS/BLUE SHIELD

## 2016-10-09 ENCOUNTER — Ambulatory Visit: Payer: BLUE CROSS/BLUE SHIELD

## 2016-10-09 ENCOUNTER — Ambulatory Visit
Admission: RE | Admit: 2016-10-09 | Discharge: 2016-10-09 | Disposition: A | Discharge status: Home / Self Care | Payer: BLUE CROSS/BLUE SHIELD | Source: Ambulatory Visit | Attending: Physician Assistant | Admitting: Physician Assistant

## 2016-10-09 DIAGNOSIS — N63 Unspecified lump in unspecified breast: Secondary | ICD-10-CM

## 2016-10-12 ENCOUNTER — Encounter: Payer: Self-pay | Admitting: Emergency Medicine

## 2016-10-12 ENCOUNTER — Encounter: Payer: Self-pay | Admitting: Neurology

## 2016-10-19 ENCOUNTER — Telehealth: Payer: Self-pay | Admitting: Gastroenterology

## 2016-10-19 NOTE — Telephone Encounter (Signed)
Call her back at @ [phone]

## 2016-10-19 NOTE — Telephone Encounter (Signed)
Patient scheduled for colonoscopy at [facility] on 12/31/16, as that is when she will have time off. I tried to schedule her for pre-visit but since she was at work not able to complete this. She will call back to schedule PV.

## 2016-10-31 ENCOUNTER — Other Ambulatory Visit: Payer: BLUE CROSS/BLUE SHIELD

## 2016-10-31 ENCOUNTER — Ambulatory Visit
Admission: RE | Admit: 2016-10-31 | Discharge: 2016-10-31 | Disposition: A | Discharge status: Home / Self Care | Payer: BLUE CROSS/BLUE SHIELD | Source: Ambulatory Visit | Attending: Neurology | Admitting: Neurology

## 2016-10-31 DIAGNOSIS — R413 Other amnesia: Secondary | ICD-10-CM

## 2016-11-04 ENCOUNTER — Telehealth: Payer: Self-pay

## 2016-11-04 NOTE — Telephone Encounter (Signed)
-----   Message from Cameron Sprang, MD sent at 11/03/2016  3:58 PM EDT ----- Please let patient know I reviewed MRI brain and it is normal, no evidence of tumor, stroke, or bleed. thanks

## 2016-11-04 NOTE — Telephone Encounter (Signed)
Spoke with pt relaying message below.   

## 2016-12-04 ENCOUNTER — Ambulatory Visit: Payer: BLUE CROSS/BLUE SHIELD | Admitting: Family Medicine

## 2016-12-04 ENCOUNTER — Other Ambulatory Visit: Payer: Self-pay | Admitting: Family Medicine

## 2016-12-04 ENCOUNTER — Encounter: Payer: Self-pay | Admitting: Family Medicine

## 2016-12-04 VITALS — BP 110/70 | HR 88 | Temp 99.2°F | Ht 68.0 in | Wt 167.0 lb

## 2016-12-04 DIAGNOSIS — G8929 Other chronic pain: Secondary | ICD-10-CM | POA: Diagnosis not present

## 2016-12-04 DIAGNOSIS — J069 Acute upper respiratory infection, unspecified: Secondary | ICD-10-CM

## 2016-12-04 DIAGNOSIS — S29011A Strain of muscle and tendon of front wall of thorax, initial encounter: Secondary | ICD-10-CM

## 2016-12-04 DIAGNOSIS — M25511 Pain in right shoulder: Secondary | ICD-10-CM | POA: Diagnosis not present

## 2016-12-04 MED ORDER — METHYLPREDNISOLONE ACETATE 40 MG/ML IJ SUSP
40.0000 mg | Freq: Once | INTRAMUSCULAR | Status: AC
  Administered 2016-12-04: 40 mg via INTRAMUSCULAR

## 2016-12-04 MED ORDER — ALPRAZOLAM 0.5 MG PO TABS: 0.5000 mg | ORAL_TABLET | Freq: Every evening | ORAL | 1 refills | Status: DC | PRN

## 2016-12-04 MED ORDER — ACYCLOVIR 400 MG PO TABS: 400.0000 mg | ORAL_TABLET | Freq: Three times a day (TID) | ORAL | 0 refills | Status: DC

## 2016-12-04 MED ORDER — METOCLOPRAMIDE HCL 10 MG PO TABS: 10.0000 mg | ORAL_TABLET | Freq: Three times a day (TID) | ORAL | 0 refills | Status: DC | PRN

## 2016-12-04 NOTE — Progress Notes (Signed)
Pre visit review using our clinic review tool, if applicable. No additional management support is needed unless otherwise documented below in the visit note. 

## 2016-12-04 NOTE — Progress Notes (Signed)
Musculoskeletal Exam  Patient: Laura Dougherty DOB: 08/21/1970  DOS: 12/04/2016  SUBJECTIVE:  Chief Complaint:   Chief Complaint  Patient presents with  . Shoulder Pain    Melitta Tigue is a 46 y.o.  female for evaluation and treatment of R shoulder pain.   Onset:  3 weeks ago. Myriad of injuries when she was .  Location: R upper shoulder, getting worse Character:  aching  Associated symptoms: Some decreased ROM, pain over tram and SCM Treatment: to date has been rest, stretches/exercises for trap- noncompliant and they have been helpful Neurovascular symptoms: no  Duration: 3 days  Associated symptoms: feeling low on energy, sinus pressure, ST Denies: cough, sinus pain, rhinorrhea, itchy watery eyes, ear pain, ear drainage and fevers/rigors Treatment to date: Maxalt Sick contacts: Yes- son was sick, got better w supportive care    ROS: Musculoskeletal/Extremities: +R shoulder pain Neurologic: no numbness, tingling no weakness   Past Medical History:  Diagnosis Date  . Anxiety and depression    Hx of  . Colon polyps 2014   Benign  . Environmental allergies   . GERD (gastroesophageal reflux disease)   . History of chicken pox   . Migraines   . PTSD (post-traumatic stress disorder)   . Seasonal allergies   . Urine incontinence    Vaginal sling approx 12 yrs ago   Family History  Adopted: Yes  Problem Relation Age of Onset  . Mental illness Mother        Manic Depression  . Alcoholism Father   . Stroke Sister        #1 87  . Clotting disorder Sister        #1  . Celiac disease Sister        23  . Raynaud syndrome Sister        #1  . Colitis Sister        Half  . Healthy Brother        Half  . Stroke Brother        Half  . Ovarian cancer Maternal Aunt   . Cervical cancer Maternal Aunt   . Learning disabilities Son        #1  . Autism Neg Hx        #2  . Anxiety disorder Neg Hx        #3   Current Outpatient Medications on File Prior  to Visit  Medication Sig Dispense Refill  . Ascorbic Acid (VITAMIN C PO) Take 1,500 mg by mouth daily.    Marland Kitchen b complex vitamins tablet Take 1 tablet by mouth as needed.    . Bromelains (BROMELAIN PO) Take by mouth as needed.    . Cholecalciferol (VITAMIN D PO) Take 5,000 Units by mouth 2 (two) times daily.     . cyclobenzaprine (FLEXERIL) 10 MG tablet Take 10 mg by mouth as needed for muscle spasms.    . famotidine (PEPCID) 40 MG tablet Take 40 mg by mouth daily.    Marland Kitchen OVER THE COUNTER MEDICATION     . OVER THE COUNTER MEDICATION Digestive Aid    . Probiotic Product (PROBIOTIC DAILY PO) Take 1 capsule by mouth daily.     Allergies  Allergen Reactions  . Gluten Meal Nausea And Vomiting  . Kelp [Iodine] Rash  . Levofloxacin Rash  . Sulfamethoxazole-Trimethoprim Rash    allergic to Bactrim      Objective: VITAL SIGNS: BP 110/70 (BP Location: Left Arm, Patient Position: Sitting, Cuff Size:  Normal)   Pulse 88   Temp 99.2 F (37.3 C) (Oral)   Ht _0  (1.727 m)   Wt 167 lb (75.8 kg)   SpO2 99%   BMI 25.39 kg/m  Constitutional: Well formed, well developed. No acute distress. Cardiovascular: RRR, Brisk cap refill Thorax & Lungs: CTAB, No accessory muscle use Extremities: No clubbing. No cyanosis. No edema.  Skin: Warm. Dry. No erythema. No rash.  Musculoskeletal: R shoulder.   Normal active range of motion: yes.   Normal passive range of motion: yes Tenderness to palpation: no Deformity: no Ecchymosis: no Tests positive: Empty can Tests negative: Neer's, Hawkins, Speed's, Lift off, Cross over, Anderson Neurologic: Normal sensory function. No focal deficits noted. DTR's equal and symmetry in UE's. No clonus. Psychiatric: Normal mood. Age appropriate judgment and insight. Alert & oriented x 3.    Procedure Note; R Shoulder joint injection Informed consent obtained. The area was palpated, an area was marked posterior to the acromion process approximately 2 cm inferiorly, and  cleaned with Betadine x1. A 27-gauge needle, while aiming towards the coracoid process, was used to enter the joint posteriorally with ease. 40 mg of Depo with 2 mL of 1% lidocaine was injected. Bandage placed.  The patient tolerated the procedure well. There were no complications noted.   Assessment:  Chronic right shoulder pain  Viral URI  Pectoralis muscle strain, initial encounter  Plan: Orders as above. Stretches/exercises for [employer] given. Hand hygiene, fluids, rest, seek care if fevers or worsening symptoms. F/u in 4 weeks. Consider PT.  The patient voiced understanding and agreement to the plan.   [place], DO 12/04/16  4:45 PM

## 2016-12-04 NOTE — Addendum Note (Signed)
Addended by: Sharon Seller B on: 12/04/2016 04:48 PM   Modules accepted: Orders

## 2016-12-04 NOTE — Patient Instructions (Addendum)
Pectoralis Major Stretches/exercises Do exercises exactly as told by your health care provider and adjust them as directed. It is normal to feel mild stretching, pulling, tightness, or discomfort as you do these exercises, but you should stop right away if you feel sudden pain or your pain gets worse. Stretching and range of motion exercises These exercises warm up your muscles and joints and improve the movement and flexibility of your shoulder. These exercises can also help to relieve pain, numbness, and tingling. Exercise A: Pendulum  1. Stand near a wall or a surface that you can hold onto for balance. 2. Bend at the waist and let your left / right arm hang straight down. Use your other arm to keep your balance. 3. Relax your arm and shoulder muscles, and move your hips and your trunk so your left / right arm swings freely. Your arm should swing because of the motion of your body, not because you are using your arm or shoulder muscles. 4. Keep moving so your arm swings in the following directions, as told by your health care provider: ? Side to side. ? Forward and backward. ? In clockwise and counterclockwise circles. 5. Slowly return to the starting position. Repeat 2 times. Complete this exercise 3 times per week. Exercise B: Abduction, standing 1. Stand and hold a broomstick, a cane, or a similar object. Place your hands a little more than shoulder-width apart on the object. Your left / right hand should be palm-up, and your other hand should be palm-down. 2. While keeping your elbow straight and your shoulder muscles relaxed, push the stick across your body toward your left / right side. Raise your left / right arm to the side of your body and then over your head until you feel a stretch in your shoulder. ? Stop when you reach the angle that is recommended by your health care provider. ? Avoid shrugging your shoulder while you raise your arm. Keep your shoulder blade tucked down toward  the middle of your spine. 3. Hold for 30 seconds. 4. Slowly return to the starting position. Repeat 2 times. Complete this exercise 3 times per week. Exercise C: Wand flexion, supine  1. Lie on your back. You may bend your knees for comfort. 2. Hold a broomstick, a cane, or a similar object so that your hands are about shoulder-width apart on the object. Your palms should face toward your feet. 3. Raise your left / right arm in front of your face, then behind your head (toward the floor). Use your other hand to help you do this. Stop when you feel a gentle stretch in your shoulder, or when you reach the angle that is recommended by your health care provider. 4. Hold for 30 seconds. 5. Use the broomstick and your other arm to help you return your left / right arm to the starting position. Repeat 2 times. Complete this exercise 3 times per week. Exercise D: Wand shoulder external rotation 1. Stand and hold a broomstick, a cane, or a similar object so your handsare about shoulder-width apart on the object. 2. Start with your arms hanging down, then bend both elbows to an "L" shape (90 degrees). 3. Keep your left / right elbow at your side. Use your other hand to push the stick so your left / right forearm moves away from your body, out to your side. ? Keep your left / right elbow bent to 90 degrees and keep it against your side. ? Stop when  you feel a gentle stretch in your shoulder, or when you reach the angle recommended by your health care provider. 4. Hold for 30 seconds. 5. Use the stick to help you return your left / right arm to the starting position. Repeat 2 times. Complete this exercise 3 times per week. Strengthening exercises These exercises build strength and endurance in your shoulder. Endurance is the ability to use your muscles for a long time, even after your muscles get tired. Exercise E: Scapular protraction, standing 1. Stand so you are facing a wall. Place your feet about  one arm-length away from the wall. 2. Place your hands on the wall and straighten your elbows. 3. Keep your hands on the wall as you push your upper back away from the wall. You should feel your shoulder blades sliding forward.Keep your elbows and your head still. ? If you are not sure that you are doing this exercise correctly, ask your health care provider for more instructions. 4. Hold for 3 seconds. 5. Slowly return to the starting position. Let your muscles relax completely before you repeat this exercise. Repeat 2 times. Complete this exercise 3 times per week. Exercise F: Shoulder blade squeezes  (scapular retraction) 1. Sit with good posture in a stable chair. Do not let your back touch the back of the chair. 2. Your arms should be at your sides with your elbows bent. You may rest your forearms on a pillow if that is more comfortable. 3. Squeeze your shoulder blades together. Bring them down and back. ? Keep your shoulders level. ? Do not lift your shoulders up toward your ears. 4. Hold for 3 seconds. 5. Return to the starting position. Repeat 2 times. Complete this exercise 3 times per week. This information is not intended to replace advice given to you by your health care provider. Make sure you discuss any questions you have with your health care provider. Document Released: 12/22/2004 Document Revised: 10/03/2015 Document Reviewed: 09/09/2014 Elsevier Interactive Patient Education  [employer].

## 2016-12-09 ENCOUNTER — Ambulatory Visit: Payer: BLUE CROSS/BLUE SHIELD | Admitting: Family Medicine

## 2016-12-09 ENCOUNTER — Encounter: Payer: Self-pay | Admitting: Family Medicine

## 2016-12-09 ENCOUNTER — Other Ambulatory Visit (HOSPITAL_COMMUNITY)
Admission: RE | Admit: 2016-12-09 | Discharge: 2016-12-09 | Disposition: A | Discharge status: Home / Self Care | Payer: BLUE CROSS/BLUE SHIELD | Source: Ambulatory Visit | Attending: Family Medicine | Admitting: Family Medicine

## 2016-12-09 VITALS — BP 118/72 | HR 73 | Temp 98.3°F | Ht 68.0 in | Wt 166.5 lb

## 2016-12-09 DIAGNOSIS — R300 Dysuria: Secondary | ICD-10-CM | POA: Insufficient documentation

## 2016-12-09 LAB — POC URINALSYSI DIPSTICK (AUTOMATED)
Bilirubin, UA: NEGATIVE
GLUCOSE UA: NEGATIVE
KETONES UA: NEGATIVE
Leukocytes, UA: NEGATIVE
Nitrite, UA: NEGATIVE
Protein, UA: NEGATIVE
RBC UA: NEGATIVE
SPEC GRAV UA: 1.01 (ref 1.010–1.025)
UROBILINOGEN UA: NEGATIVE U/dL — AB
pH, UA: 7 (ref 5.0–8.0)

## 2016-12-09 MED ORDER — RIZATRIPTAN BENZOATE 10 MG PO TABS: 10.0000 mg | ORAL_TABLET | ORAL | 2 refills | Status: DC | PRN

## 2016-12-09 MED ORDER — ALPRAZOLAM 0.5 MG PO TABS: 0.5000 mg | ORAL_TABLET | Freq: Every evening | ORAL | 1 refills | Status: AC | PRN

## 2016-12-09 NOTE — Progress Notes (Signed)
Pre visit review using our clinic review tool, if applicable. No additional management support is needed unless otherwise documented below in the visit note. 

## 2016-12-09 NOTE — Addendum Note (Signed)
Addended by: Sharon Seller B on: 12/09/2016 03:56 PM   Modules accepted: Orders

## 2016-12-09 NOTE — Progress Notes (Signed)
Chief Complaint  Patient presents with  . Urinary Tract Infection    urine odor  . Dysuria  . Back Pain    Laura Dougherty is a 46 y.o. female here for possible UTI.  Duration: 3 days. Symptoms: odor Denies: urinary frequency, hematuria, urinary retention, fever, nausea, vomiting and dysuria, vaginal discharge Hx of recurrent UTI? Yes- sees Urology Denies new sexual partners.  ROS:  Constitutional: denies fever GU: As noted in HPI MSK: Denies back pain Abd: Denies constipation or abdominal pain  Past Medical History:  Diagnosis Date  . Anxiety and depression    Hx of  . Colon polyps 2014   Benign  . Environmental allergies   . GERD (gastroesophageal reflux disease)   . History of chicken pox   . Migraines   . PTSD (post-traumatic stress disorder)   . Seasonal allergies   . Urine incontinence    Vaginal sling approx 12 yrs ago      BP 118/72 (BP Location: Left Arm, Patient Position: Sitting, Cuff Size: Normal)   Pulse 73   Temp 98.3 F (36.8 C) (Oral)   Ht 5\' 8"  (1.727 m)   Wt 166 lb 8 oz (75.5 kg)   SpO2 97%   BMI 25.32 kg/m  General: Awake, alert, appears stated age 58: MMM Heart: RRR, no murmurs Lungs: CTAB, normal respiratory effort, no accessory muscle usage Abd: BS+, soft, mild suprapubic TTP, ND, no masses or organomegaly MSK: No CVA tenderness, neg Lloyd's sign Psych: Age appropriate judgment and insight  Low back pain without sciatica, unspecified back pain laterality, unspecified chronicity - Plan: POCT Urinalysis Dipstick (Automated), Urine Culture  Dysuria - Plan: POCT Urinalysis Dipstick (Automated), Urine Culture  Not classic for her uti, will culture. 10 d Keflex if positive. F/u prn. The patient voiced understanding and agreement to the plan.  [place], DO 12/09/16 3:42 PM

## 2016-12-09 NOTE — Patient Instructions (Signed)
EXERCISES  RANGE OF MOTION (ROM) AND STRETCHING EXERCISES These exercises may help you when beginning to rehabilitate your injury. While completing these exercises, remember:   Restoring tissue flexibility helps normal motion to return to the joints. This allows healthier, less painful movement and activity.  An effective stretch should be held for at least 30 seconds.  A stretch should never be painful. You should only feel a gentle lengthening or release in the stretched tissue.  ROM - Pendulum  Bend at the waist so that your right / left arm falls away from your body. Support yourself with your opposite hand on a solid surface, such as a table or a countertop.  Your right / left arm should be perpendicular to the ground. If it is not perpendicular, you need to lean over farther. Relax the muscles in your right / left arm and shoulder as much as possible.  Gently sway your hips and trunk so they move your right / left arm without any use of your right / left shoulder muscles.  Progress your movements so that your right / left arm moves side to side, then forward and backward, and finally, both clockwise and counterclockwise.  Complete 10-15 repetitions in each direction. Many people use this exercise to relieve discomfort in their shoulder as well as to gain range of motion. Repeat 2 times. Complete this exercise 3 times per week.  STRETCH - Flexion, Standing  Stand with good posture. With an underhand grip on your right / left hand and an overhand grip on the opposite hand, grasp a broomstick or cane so that your hands are a little more than shoulder-width apart.  Keeping your right / left elbow straight and shoulder muscles relaxed, push the stick with your opposite hand to raise your right / left arm in front of your body and then overhead. Raise your arm until you feel a stretch in your right / left shoulder, but before you have increased shoulder pain.  Try to avoid shrugging your  right / left shoulder as your arm rises by keeping your shoulder blade tucked down and toward your mid-back spine. Hold 30 seconds.  Slowly return to the starting position. Repeat 2 times. Complete this exercise 3 times per week.  STRETCH - Internal Rotation  Place your right / left hand behind your back, palm-up.  Throw a towel or belt over your opposite shoulder. Grasp the towel/belt with your right / left hand.  While keeping an upright posture, gently pull up on the towel/belt until you feel a stretch in the front of your right / left shoulder.  Avoid shrugging your right / left shoulder as your arm rises by keeping your shoulder blade tucked down and toward your mid-back spine.  Hold 30. Release the stretch by lowering your opposite hand. Repeat 2 times. Complete this exercise 3 times per week.  STRETCH - External Rotation and Abduction  Stagger your stance through a doorframe. It does not matter which foot is forward.  As instructed by your physician, physical therapist or athletic trainer, place your hands: ? And forearms above your head and on the door frame. ? And forearms at head-height and on the door frame. ? At elbow-height and on the door frame.  Keeping your head and chest upright and your stomach muscles tight to prevent over-extending your low-back, slowly shift your weight onto your front foot until you feel a stretch across your chest and/or in the front of your shoulders.  Hold 30  seconds. Shift your weight to your back foot to release the stretch. Repeat 2 times. Complete this stretch 3 times per week.   STRENGTHENING EXERCISES  These exercises may help you when beginning to rehabilitate your injury. They may resolve your symptoms with or without further involvement from your physician, physical therapist or athletic trainer. While completing these exercises, remember:   Muscles can gain both the endurance and the strength needed for everyday activities through  controlled exercises.  Complete these exercises as instructed by your physician, physical therapist or athletic trainer. Progress the resistance and repetitions only as guided.  You may experience muscle soreness or fatigue, but the pain or discomfort you are trying to eliminate should never worsen during these exercises. If this pain does worsen, stop and make certain you are following the directions exactly. If the pain is still present after adjustments, discontinue the exercise until you can discuss the trouble with your clinician.  If advised by your physician, during your recovery, avoid activity or exercises which involve actions that place your right / left hand or elbow above your head or behind your back or head. These positions stress the tissues which are trying to heal.  STRENGTH - Scapular Depression and Adduction  With good posture, sit on a firm chair. Supported your arms in front of you with pillows, arm rests or a table top. Have your elbows in line with the sides of your body.  Gently draw your shoulder blades down and toward your mid-back spine. Gradually increase the tension without tensing the muscles along the top of your shoulders and the back of your neck.  Hold for 3 seconds. Slowly release the tension and relax your muscles completely before completing the next repetition.  After you have practiced this exercise, remove the arm support and complete it in standing as well as sitting. Repeat 2 times. Complete this exercise 3 times per week.   STRENGTH - External Rotators  Secure a rubber exercise band/tubing to a fixed object so that it is at the same height as your right / left elbow when you are standing or sitting on a firm surface.  Stand or sit so that the secured exercise band/tubing is at your side that is not injured.  Bend your elbow 90 degrees. Place a folded towel or small pillow under your right / left arm so that your elbow is a few inches away from your  side.  Keeping the tension on the exercise band/tubing, pull it away from your body, as if pivoting on your elbow. Be sure to keep your body steady so that the movement is only coming from your shoulder rotating.  Hold 3 seconds. Release the tension in a controlled manner as you return to the starting position. Repeat 2 times. Complete this exercise 3 times per week.   STRENGTH - Supraspinatus  Stand or sit with good posture. Grasp a 2-3 lb weight or an exercise band/tubing so that your hand is "thumbs-up," like when you shake hands.  Slowly lift your right / left hand from your thigh into the air, traveling about 30 degrees from straight out at your side. Lift your hand to shoulder height or as far as you can without increasing any shoulder pain. Initially, many people do not lift their hands above shoulder height.  Avoid shrugging your right / left shoulder as your arm rises by keeping your shoulder blade tucked down and toward your mid-back spine.  Hold for 3 seconds. Control the descent  of your hand as you slowly return to your starting position. Repeat 2 times. Complete this exercise 3 times per week.   STRENGTH - Shoulder Extensors  Secure a rubber exercise band/tubing so that it is at the height of your shoulders when you are either standing or sitting on a firm arm-less chair.  With a thumbs-up grip, grasp an end of the band/tubing in each hand. Straighten your elbows and lift your hands straight in front of you at shoulder height. Step back away from the secured end of band/tubing until it becomes tense.  Squeezing your shoulder blades together, pull your hands down to the sides of your thighs. Do not allow your hands to go behind you.  Hold for 3 seconds. Slowly ease the tension on the band/tubing as you reverse the directions and return to the starting position. Repeat 2 times. Complete this exercise 3 times per week.   STRENGTH - Scapular Retractors  Secure a rubber  exercise band/tubing so that it is at the height of your shoulders when you are either standing or sitting on a firm arm-less chair.  With a palm-down grip, grasp an end of the band/tubing in each hand. Straighten your elbows and lift your hands straight in front of you at shoulder height. Step back away from the secured end of band/tubing until it becomes tense.  Squeezing your shoulder blades together, draw your elbows back as you bend them. Keep your upper arm lifted away from your body throughout the exercise.  Hold 3 seconds. Slowly ease the tension on the band/tubing as you reverse the directions and return to the starting position. Repeat 2 times. Complete this exercise 3 times per week.  STRENGTH - Scapular Depressors  Find a sturdy chair without wheels, such as a from a dining room table.  Keeping your feet on the floor, lift your bottom from the seat and lock your elbows.  Keeping your elbows straight, allow gravity to pull your body weight down. Your shoulders will rise toward your ears.  Raise your body against gravity by drawing your shoulder blades down your back, shortening the distance between your shoulders and ears. Although your feet should always maintain contact with the floor, your feet should progressively support less body weight as you get stronger.  Hold 3 seconds. In a controlled and slow manner, lower your body weight to begin the next repetition. Repeat 2 times. Complete this exercise 3 times per week.   This information is not intended to replace advice given to you by your health care provider. Make sure you discuss any questions you have with your health care provider.  Document Released: 11/05/2004 Document Revised: 01/12/2014 Document Reviewed: 04/05/2008 Elsevier Interactive Patient Education [employer].

## 2016-12-10 LAB — URINE CULTURE
MICRO NUMBER:: 81368057
RESULT: NO GROWTH
SPECIMEN QUALITY:: ADEQUATE

## 2016-12-11 ENCOUNTER — Encounter: Payer: Self-pay | Admitting: Family Medicine

## 2016-12-11 ENCOUNTER — Other Ambulatory Visit: Payer: Self-pay | Admitting: Family Medicine

## 2016-12-11 LAB — URINE CYTOLOGY ANCILLARY ONLY
CHLAMYDIA, DNA PROBE: NEGATIVE
NEISSERIA GONORRHEA: NEGATIVE
Trichomonas: NEGATIVE

## 2016-12-11 MED ORDER — CEPHALEXIN 500 MG PO CAPS: 500.0000 mg | ORAL_CAPSULE | Freq: Two times a day (BID) | ORAL | 0 refills | Status: AC

## 2016-12-11 NOTE — Progress Notes (Signed)
[facility] message noted, sent back regarding abx.

## 2016-12-17 ENCOUNTER — Other Ambulatory Visit: Payer: Self-pay | Admitting: Family Medicine

## 2016-12-17 ENCOUNTER — Telehealth: Payer: Self-pay | Admitting: Family Medicine

## 2016-12-17 LAB — URINE CYTOLOGY ANCILLARY ONLY
Bacterial vaginitis: POSITIVE — AB
CANDIDA VAGINITIS: NEGATIVE

## 2016-12-17 MED ORDER — CLINDAMYCIN PHOSPHATE 2 % VA CREA: 1.0000 | TOPICAL_CREAM | Freq: Every day | VAGINAL | 0 refills | Status: AC

## 2016-12-17 MED ORDER — METRONIDAZOLE 500 MG PO TABS: 500.0000 mg | ORAL_TABLET | Freq: Two times a day (BID) | ORAL | 0 refills | Status: DC

## 2016-12-17 NOTE — Telephone Encounter (Signed)
OK. Done. TY.

## 2016-12-17 NOTE — Telephone Encounter (Signed)
Patient notified

## 2016-12-17 NOTE — Telephone Encounter (Signed)
Copied from [place] #[id number]. Topic: Quick Communication - See Telephone Encounter >> Dec 17, 2016  3:00 PM Bea Graff, NT wrote: CRM for notification. See Telephone encounter for: Sometimes the flagyl makes this pt sick and she wants to see if the clindamycin suppository can be called in to her pharmacy just in case? Uses [facility] on [address]   Also wants to know how next time to only have a urinalysis next time instead of a bunch of other labs being order so that her insurance won't bill he for all the other labs? When she thought she would just have her urine tested for a UTI.  She wants to know in the future before the labs are being done so she knows what she is being tested for. Please advise.   12/17/16.

## 2016-12-18 ENCOUNTER — Ambulatory Visit (AMBULATORY_SURGERY_CENTER): Payer: Self-pay | Admitting: *Deleted

## 2016-12-18 ENCOUNTER — Other Ambulatory Visit: Payer: Self-pay

## 2016-12-18 VITALS — Ht 68.0 in | Wt 163.0 lb

## 2016-12-18 DIAGNOSIS — Z86010 Personal history of colonic polyps: Secondary | ICD-10-CM

## 2016-12-18 MED ORDER — PEG-KCL-NACL-NASULF-NA ASC-C 140 G PO SOLR: 1.0000 | ORAL | 0 refills | Status: DC

## 2016-12-18 NOTE — Progress Notes (Signed)
No egg or soy allergy known to patient  No issues with past sedation with any surgeries  or procedures, no intubation problems  No diet pills per patient No home 02 use per patient  No blood thinners per patient  Pt denies issues with constipation  No A fib or A flutter  EMMI video sent to pt's e mail  Pt. declined 

## 2016-12-30 ENCOUNTER — Telehealth: Payer: Self-pay | Admitting: Gastroenterology

## 2016-12-30 NOTE — Telephone Encounter (Signed)
Patient advised to take Maxalt.

## 2016-12-30 NOTE — Telephone Encounter (Signed)
Routed to DOD, Dr. Havery Moros patient, she is scheduled for colonoscopy tomorrow at 1:30, feels a migraine coming on and wants to know if she could take her prescribed Maxalt?

## 2016-12-30 NOTE — Telephone Encounter (Signed)
Routed to Dr. Carlean Purl, as Dr. Silverio Decamp is not in the office today. Please see below.

## 2016-12-30 NOTE — Telephone Encounter (Signed)
May take Maxalt

## 2016-12-31 ENCOUNTER — Ambulatory Visit (AMBULATORY_SURGERY_CENTER): Payer: BLUE CROSS/BLUE SHIELD | Admitting: Gastroenterology

## 2016-12-31 ENCOUNTER — Encounter: Payer: Self-pay | Admitting: Gastroenterology

## 2016-12-31 VITALS — BP 110/69 | HR 67 | Temp 98.6°F | Resp 11 | Ht 68.0 in | Wt 166.0 lb

## 2016-12-31 DIAGNOSIS — D120 Benign neoplasm of cecum: Secondary | ICD-10-CM

## 2016-12-31 DIAGNOSIS — Z86010 Personal history of colonic polyps: Secondary | ICD-10-CM | POA: Diagnosis present

## 2016-12-31 DIAGNOSIS — D123 Benign neoplasm of transverse colon: Secondary | ICD-10-CM

## 2016-12-31 MED ORDER — SODIUM CHLORIDE 0.9 % IV SOLN: 500.0000 mL | Freq: Once | INTRAVENOUS | Status: DC

## 2016-12-31 NOTE — Patient Instructions (Addendum)
YOU HAD AN ENDOSCOPIC PROCEDURE TODAY AT THE [facility]:   Refer to the procedure report that was given to you for any specific questions about what was found during the examination.  If the procedure report does not answer your questions, please call your gastroenterologist to clarify.  If you requested that your care partner not be given the details of your procedure findings, then the procedure report has been included in a sealed envelope for you to review at your convenience later.  YOU SHOULD EXPECT: Some feelings of bloating in the abdomen. Passage of more gas than usual.  Walking can help get rid of the air that was put into your GI tract during the procedure and reduce the bloating. If you had a lower endoscopy (such as a colonoscopy or flexible sigmoidoscopy) you may notice spotting of blood in your stool or on the toilet paper. If you underwent a bowel prep for your procedure, you may not have a normal bowel movement for a few days.  Please Note:  You might notice some irritation and congestion in your nose or some drainage.  This is from the oxygen used during your procedure.  There is no need for concern and it should clear up in a day or so.  SYMPTOMS TO REPORT IMMEDIATELY:   Following lower endoscopy (colonoscopy or flexible sigmoidoscopy):  Excessive amounts of blood in the stool  Significant tenderness or worsening of abdominal pains  Swelling of the abdomen that is new, acute  Fever of 100F or higher  For urgent or emergent issues, a gastroenterologist can be reached at any hour by calling [phone].   DIET:  We do recommend a small meal at first, but then you may proceed to your regular diet.  Drink plenty of fluids but you should avoid alcoholic beverages for 24 hours.  ACTIVITY:  You should plan to take it easy for the rest of today and you should NOT DRIVE or use heavy machinery until tomorrow (because of the sedation medicines used during the test).     FOLLOW UP: Our staff will call the number listed on your records the next business day following your procedure to check on you and address any questions or concerns that you may have regarding the information given to you following your procedure. If we do not reach you, we will leave a message.  However, if you are feeling well and you are not experiencing any problems, there is no need to return our call.  We will assume that you have returned to your regular daily activities without incident.  If any biopsies were taken you will be contacted by phone or by letter within the next 1-3 weeks.  Please call us at [phone] if you have not heard about the biopsies in 3 weeks.   Await for biopsy results to determine next repeat Colonoscopy screening Polyps (handout given) Diverticulosis (handout given) Hemorrhoids (handout given)   SIGNATURES/CONFIDENTIALITY: You and/or your care partner have signed paperwork which will be entered into your electronic medical record.  These signatures attest to the fact that that the information above on your After Visit Summary has been reviewed and is understood.  Full responsibility of the confidentiality of this discharge information lies with you and/or your care-partner. 

## 2016-12-31 NOTE — Progress Notes (Signed)
Pt's states no medical or surgical changes since previsit or office visit. 

## 2016-12-31 NOTE — Op Note (Signed)
[facility] Patient Name: Laura Dougherty Procedure Date: 12/31/2016 1:17 PM MRN: [id number] Endoscopist: Remo Lipps P. Genesys Coggeshall MD, MD Age: 46 Referring MD:  Date of Birth: 1970-12-07 Gender: Female Account #: [id number] Procedure:                Colonoscopy Indications:              High risk colon cancer surveillance: Personal                            history of colonic polyps Medicines:                Monitored Anesthesia Care Procedure:                Pre-Anesthesia Assessment:                           - Prior to the procedure, a History and Physical                            was performed, and patient medications and                            allergies were reviewed. The patient's tolerance of                            previous anesthesia was also reviewed. The risks                            and benefits of the procedure and the sedation                            options and risks were discussed with the patient.                            All questions were answered, and informed consent                            was obtained. Prior Anticoagulants: The patient has                            taken no previous anticoagulant or antiplatelet                            agents. ASA Grade Assessment: II - A patient with                            mild systemic disease. After reviewing the risks                            and benefits, the patient was deemed in                            satisfactory condition to undergo the procedure.  After obtaining informed consent, the colonoscope                            was passed under direct vision. Throughout the                            procedure, the patient's blood pressure, pulse, and                            oxygen saturations were monitored continuously. The                            Colonoscope was introduced through the anus and                            advanced to the the  terminal ileum, with                            identification of the appendiceal orifice and IC                            valve. The colonoscopy was performed without                            difficulty. The patient tolerated the procedure                            well. The quality of the bowel preparation was                            adequate. The terminal ileum, ileocecal valve,                            appendiceal orifice, and rectum were photographed. Scope In: 1:28:04 PM Scope Out: 1:49:00 PM Scope Withdrawal Time: 0 hours 18 minutes 25 seconds  Total Procedure Duration: 0 hours 20 minutes 56 seconds  Findings:                 The perianal and digital rectal examinations were                            normal.                           The terminal ileum appeared normal.                           A 3 mm polyp was found in the cecum. The polyp was                            sessile. The polyp was removed with a cold snare.                            Resection and retrieval were complete.  Two sessile polyps were found in the transverse                            colon. The polyps were 3 to 4 mm in size. These                            polyps were removed with a cold snare. Resection                            and retrieval were complete.                           A few small-mouthed diverticula were found in the                            sigmoid colon.                           Internal hemorrhoids were found during                            retroflexion. The hemorrhoids were small.                           The exam was otherwise without abnormality. Of                            note, I thought a photo of the IC valve was taken,                            but it was note. The IC valve was normal. Complications:            No immediate complications. Estimated blood loss:                            Minimal. Estimated Blood Loss:     Estimated  blood loss was minimal. Impression:               - The examined portion of the ileum was normal.                           - One 3 mm polyp in the cecum, removed with a cold                            snare. Resected and retrieved.                           - Two 3 to 4 mm polyps in the transverse colon,                            removed with a cold snare. Resected and retrieved.                           - Diverticulosis in the sigmoid  colon.                           - Internal hemorrhoids.                           - The examination was otherwise normal. Recommendation:           - Patient has a contact number available for                            emergencies. The signs and symptoms of potential                            delayed complications were discussed with the                            patient. Return to normal activities tomorrow.                            Written discharge instructions were provided to the                            patient.                           - Resume previous diet.                           - Continue present medications.                           - Await pathology results.                           - Repeat colonoscopy is recommended for                            surveillance. The colonoscopy date will be                            determined after pathology results from today's                            exam become available for review. Remo Lipps P. Mecca Guitron MD, MD 12/31/2016 1:54:33 PM This report has been signed electronically.

## 2016-12-31 NOTE — Progress Notes (Signed)
Called to room to assist during endoscopic procedure.  Patient ID and intended procedure confirmed with present staff. Received instructions for my participation in the procedure from the performing physician.  

## 2016-12-31 NOTE — Progress Notes (Signed)
To recovery, report to RN, VSS. 

## 2017-01-01 ENCOUNTER — Ambulatory Visit (INDEPENDENT_AMBULATORY_CARE_PROVIDER_SITE_OTHER): Payer: BLUE CROSS/BLUE SHIELD | Admitting: Family Medicine

## 2017-01-01 ENCOUNTER — Other Ambulatory Visit: Payer: BLUE CROSS/BLUE SHIELD

## 2017-01-01 ENCOUNTER — Telehealth: Payer: Self-pay

## 2017-01-01 ENCOUNTER — Encounter: Payer: Self-pay | Admitting: Family Medicine

## 2017-01-01 VITALS — BP 123/85 | HR 72 | Temp 98.6°F | Resp 16 | Wt 164.2 lb

## 2017-01-01 DIAGNOSIS — N390 Urinary tract infection, site not specified: Secondary | ICD-10-CM

## 2017-01-01 DIAGNOSIS — R748 Abnormal levels of other serum enzymes: Secondary | ICD-10-CM | POA: Diagnosis not present

## 2017-01-01 DIAGNOSIS — R740 Nonspecific elevation of levels of transaminase and lactic acid dehydrogenase [LDH]: Secondary | ICD-10-CM

## 2017-01-01 DIAGNOSIS — R7401 Elevation of levels of liver transaminase levels: Secondary | ICD-10-CM

## 2017-01-01 LAB — HEPATIC FUNCTION PANEL
ALT: 22 U/L (ref 0–35)
AST: 21 U/L (ref 0–37)
Albumin: 4.3 g/dL (ref 3.5–5.2)
Alkaline Phosphatase: 63 U/L (ref 39–117)
BILIRUBIN DIRECT: 0.1 mg/dL (ref 0.0–0.3)
TOTAL PROTEIN: 7.3 g/dL (ref 6.0–8.3)
Total Bilirubin: 0.5 mg/dL (ref 0.2–1.2)

## 2017-01-01 LAB — VITAMIN B12: Vitamin B-12: 951 pg/mL — ABNORMAL HIGH (ref 211–911)

## 2017-01-01 NOTE — Progress Notes (Signed)
Chief Complaint  Patient presents with  . Follow-up    UTI and BV is better    Subjective: Patient is a 46 y.o. female here for f/u uti/bv.  Pt here to discuss uti and bv issue. She was distraught about the BV and has been taking probiotics to help with her natural flora. She has also been taking for GI health that has been helping. She is currently doing well and off any rx meds for that. No fevers.  Had elevated B12, she was taking supplementation and was told to scale back. She is set to have a recheck.   ROS: GU: No D/C or pain   Past Medical History:  Diagnosis Date  . Allergy   . Anxiety and depression    Hx of  . Colon polyps 2014   Benign  . Depression    as a child  . Environmental allergies   . GERD (gastroesophageal reflux disease)    not presently  . History of chicken pox   . Migraines   . PTSD (post-traumatic stress disorder)   . Seasonal allergies   . Urine incontinence    Vaginal sling approx 12 yrs ago    Objective: BP 123/85   Pulse 72   Temp 98.6 F (37 C) (Oral)   Resp 16   Wt 164 lb 3.2 oz (74.5 kg)   LMP 12/04/2016 Comment: infrequent  had ablation but did not stop having periods  SpO2 100%   BMI 24.97 kg/m  General: Awake, appears stated age HEENT: MMM, EOMi Heart: RRR, no murmurs Lungs: CTAB, no rales, wheezes or rhonchi. No accessory muscle use Psych: Age appropriate judgment and insight, normal affect and mood  Assessment and Plan: Elevated vitamin B12 level - Plan: Vitamin B12  Elevated alanine aminotransferase (ALT) level - Plan: Hepatic function panel  Recurrent UTI  Orders as above. Repeat testing as above. Doing well with urinary issues.  F/u prn.  The patient voiced understanding and agreement to the plan.  [place], DO 01/01/17  11:19 AM

## 2017-01-01 NOTE — Telephone Encounter (Signed)
  Follow up Call-  Call back number 12/31/2016 07/17/2016  Post procedure Call Back phone  # [phone]  Permission to leave phone message Yes Yes  Some recent data might be hidden     Patient questions:  Do you have a fever, pain , or abdominal swelling? No. Pain Score  0 *  Have you tolerated food without any problems? Yes.    Have you been able to return to your normal activities? Yes.    Do you have any questions about your discharge instructions: Diet   No. Medications  No. Follow up visit  No.  Do you have questions or concerns about your Care? No.  Actions: * If pain score is 4 or above: No action needed, pain <4.

## 2017-01-04 ENCOUNTER — Other Ambulatory Visit: Payer: Self-pay | Admitting: Family Medicine

## 2017-01-04 MED ORDER — CYCLOBENZAPRINE HCL 10 MG PO TABS: 10.0000 mg | ORAL_TABLET | ORAL | 1 refills | Status: AC | PRN

## 2017-01-06 ENCOUNTER — Telehealth: Payer: Self-pay | Admitting: Gastroenterology

## 2017-01-06 NOTE — Telephone Encounter (Signed)
I don't see where you have had a chance to review these results yet.

## 2017-01-06 NOTE — Telephone Encounter (Signed)
Patient calling to see when results from colon on 12.27.18 will be in. Pt states she will be working a 12 hour shift so okay to leave detailed message on vm.

## 2017-01-07 ENCOUNTER — Encounter: Payer: Self-pay | Admitting: Gastroenterology

## 2017-01-07 NOTE — Telephone Encounter (Signed)
I just completed a letter to be sent to her home of record with results, she should expect to see this in the next 1-2 weeks. She had 3 small adenomas on colonoscopy. Recommend a repeat colonoscopy in 3 years for surveillance. If she has any further questions please let me know. Thanks

## 2017-01-07 NOTE — Telephone Encounter (Signed)
Left message on patient's phone with results, also she has active MyChart which she can see the letter. She should receive the hardcopy in the mail in next week.

## 2017-01-13 ENCOUNTER — Encounter: Payer: Self-pay | Admitting: Family Medicine

## 2017-01-13 ENCOUNTER — Ambulatory Visit: Payer: BLUE CROSS/BLUE SHIELD | Admitting: Family Medicine

## 2017-01-13 VITALS — BP 108/72 | HR 76 | Temp 98.4°F | Ht 66.0 in | Wt 166.0 lb

## 2017-01-13 DIAGNOSIS — N3000 Acute cystitis without hematuria: Secondary | ICD-10-CM

## 2017-01-13 MED ORDER — SULFAMETHOXAZOLE-TRIMETHOPRIM 800-160 MG PO TABS: 1.0000 | ORAL_TABLET | Freq: Two times a day (BID) | ORAL | 0 refills | Status: DC

## 2017-01-13 NOTE — Patient Instructions (Addendum)
If things do not improve, we will likely have to check a urine or you will need to follow up with your urologist.  Contact your GYN if you are interested in a hysterectomy.   Let us know if you need anything.

## 2017-01-13 NOTE — Progress Notes (Signed)
Chief Complaint  Patient presents with  . Back Pain  . Abdominal Pain  . Urinary Retention    Laura Dougherty is a 47 y.o. female here for possible UTI.  Duration: 5 days. Symptoms: odor, burning, pelvic pressure Denies: hematuria, urinary hesitancy, urinary retention, fever, nausea, vomiting, vaginal discharge Hx of recurrent UTI? Yes Denies new sexual partners. Had taken abx from urologist that she believes would affect her UA and does not wish to leave a sample today.   ROS:  Constitutional: denies fever GU: As noted in HPI MSK: Denies back pain Abd: Denies constipation or abdominal pain  Past Medical History:  Diagnosis Date  . Allergy   . Anxiety and depression    Hx of  . Colon polyps 2014   Benign  . Depression    as a child  . Environmental allergies   . GERD (gastroesophageal reflux disease)    not presently  . History of chicken pox   . Migraines   . PTSD (post-traumatic stress disorder)   . Seasonal allergies   . Urine incontinence    Vaginal sling approx 12 yrs ago   BP 108/72 (BP Location: Left Arm, Patient Position: Sitting, Cuff Size: Normal)   Pulse 76   Temp 98.4 F (36.9 C) (Oral)   Ht 5\' 6"  (1.676 m)   Wt 166 lb (75.3 kg)   SpO2 95%   BMI 26.79 kg/m  General: Awake, alert, appears stated age HEENT: MMM Heart: RRR Lungs: CTAB, normal respiratory effort, no accessory muscle usage Abd: BS+, soft, +suprapubic ttp, ND, no masses or organomegaly MSK: No CVA tenderness, neg Lloyd's sign Psych: Age appropriate judgment and insight  Acute cystitis without hematuria - Plan: sulfamethoxazole-trimethoprim (BACTRIM DS,SEPTRA DS) 800-160 MG tablet  Orders as above. Will tx, she will change her pads to tampons to see if this helps. Try Bactrim. Had discussion about how she does not believe she has a true allergy to Bactrim/sulfa.  F/u prn. The patient voiced understanding and agreement to the plan.  [place], DO 01/13/17 3:29  PM

## 2017-01-13 NOTE — Progress Notes (Signed)
Pre visit review using our clinic review tool, if applicable. No additional management support is needed unless otherwise documented below in the visit note. 

## 2017-01-19 ENCOUNTER — Ambulatory Visit: Payer: Self-pay | Admitting: *Deleted

## 2017-01-19 ENCOUNTER — Ambulatory Visit: Payer: BLUE CROSS/BLUE SHIELD | Admitting: Internal Medicine

## 2017-01-19 ENCOUNTER — Encounter: Payer: Self-pay | Admitting: Internal Medicine

## 2017-01-19 VITALS — BP 116/68 | HR 93 | Temp 98.1°F | Resp 14 | Ht 66.0 in | Wt 162.0 lb

## 2017-01-19 DIAGNOSIS — T7840XA Allergy, unspecified, initial encounter: Secondary | ICD-10-CM

## 2017-01-19 MED ORDER — TRIAMCINOLONE ACETONIDE 0.1 % EX LOTN: 1.0000 "application " | TOPICAL_LOTION | Freq: Three times a day (TID) | CUTANEOUS | 0 refills | Status: AC

## 2017-01-19 MED ORDER — PREDNISONE 10 MG PO TABS: ORAL_TABLET | ORAL | 0 refills | Status: DC

## 2017-01-19 NOTE — Telephone Encounter (Signed)
Called in c/o a hive on her abd she noticed about 1:30am this morning.   She also has a red rash that is itching and spreading all over her body.   She has been on Bactrim for a UTI.    This is her 2nd day after completing the antibiotic.   She has  A history of reactions to antibiotics but it was about 30 yrs ago. I got her an appt with Dr. Larose Kells at 8:40 this morning. She denies any shortness of breath.   I instructed her to go to the ED if she starts having difficulty breathing or becoming short of breath.    She verbalized understanding.  Reason for Disposition . Taking new prescription antibiotic (EXCEPTION: finished taking new prescription antibiotic)  Answer Assessment - Initial Assessment Questions 1. APPEARANCE of RASH: "Describe the rash." (e.g., spots, blisters, raised areas, skin peeling, scaly)     I have a hive on my abd from taking Bactrum for UTI 2. SIZE: "How big are the spots?" (e.g., tip of pen, eraser, coin; inches, centimeters)     It has spread on my scalp, my face, chest all over everywhere 3. LOCATION: "Where is the rash located?"     It's spreading all over 4. COLOR: "What color is the rash?" (Note: It is difficult to assess rash color in people with darker-colored skin. When this situation occurs, simply ask the caller to describe what they see.)     Red.   Started as a small hive now red and spreading all into rash. 5. ONSET: "When did the rash begin?"     2am this morning 6. FEVER: "Do you have a fever?" If so, ask: "What is your temperature, how was it measured, and when did it start?"     I checked it with my ear thermometer it was 99 and 98 7. ITCHING: "Does the rash itch?" If so, ask: "How bad is the itch?" (Scale 1-10; or mild, moderate, severe)     I took some Benadryl 2 pills 1:30am 8. CAUSE: "What do you think is causing the rash?"     Antibiotic Bactrum 9. NEW MEDICATION: "What new medication are you taking?" (e.g., name of antibiotic) "When did you start  taking this medication?".     Bactrum for UTI 10. OTHER SYMPTOMS: "Do you have any other symptoms?" (e.g., sore throat, fever, joint pain)       The rash is itching bad and I'm hot 11. PREGNANCY: "Is there any chance you are pregnant?" "When was your last menstrual period?"       No  Protocols used: RASH - WIDESPREAD ON DRUGS-A-AH

## 2017-01-19 NOTE — Telephone Encounter (Signed)
FYI. Pt has appt w/ you this morning.

## 2017-01-19 NOTE — Telephone Encounter (Signed)
thx

## 2017-01-19 NOTE — Progress Notes (Signed)
Subjective:    Patient ID: Laura Dougherty, female    DOB: [birth year], 47 y.o.   MRN: [id number]  DOS:  01/19/2017 Type of visit - description : acute Interval history: Was seen 01/13/2017 with UTI symptoms, after discussed the previous reaction to Bactrim 30 years ago they agreed with her PCP to try Bactrim. The last dose was 01/15/2017.  She  develop a itchy rash last night Took 2 Benadryl at 1 AM today.  This morning she feels slightly better   Review of Systems  UTI symptoms are improved. Never had lip or tongue swelling Mild cough at baseline.  No wheezing.  Past Medical History:  Diagnosis Date  . Allergy   . Anxiety and depression    Hx of  . Colon polyps 2014   Benign  . Depression    as a child  . Environmental allergies   . GERD (gastroesophageal reflux disease)    not presently  . History of chicken pox   . Migraines   . PTSD (post-traumatic stress disorder)   . Seasonal allergies   . Urine incontinence    Vaginal sling approx 12 yrs ago    Past Surgical History:  Procedure Laterality Date  . CHOLECYSTECTOMY  2008  . COLONOSCOPY    . ENDOMETRIAL ABLATION  ~2011   [facility]  . ESOPHAGOGASTRODUODENOSCOPY ENDOSCOPY    . POLYPECTOMY    . TONSILLECTOMY  20 years ago  . TUBAL LIGATION    . WISDOM TOOTH EXTRACTION      Social History   Socioeconomic History  . Marital status: Divorced    Spouse name: Not on file  . Number of children: Not on file  . Years of education: Not on file  . Highest education level: Not on file  Social Needs  . Financial resource strain: Not on file  . Food insecurity - worry: Not on file  . Food insecurity - inability: Not on file  . Transportation needs - medical: Not on file  . Transportation needs - non-medical: Not on file  Occupational History  . Not on file  Tobacco Use  . Smoking status: Never Smoker  . Smokeless tobacco: Never Used  Substance and Sexual Activity  . Alcohol use: Yes    Comment: rare    . Drug use: No    Comment: CBD oil   . Sexual activity: Not on file  Other Topics Concern  . Not on file  Social History Narrative   Pt lives in 1 story apartment on the second floor with her 3 sons   Highest level of education - associated degree   Works as a Psychologist, forensic       Allergies as of 01/19/2017      Reactions   Bactrim [sulfamethoxazole-trimethoprim] Rash   allergic to Bactrim   Gluten Meal Nausea And Vomiting   Kelp [iodine] Rash   Levofloxacin Rash      Medication List        Accurate as of 01/19/17  1:10 PM. Always use your most recent med list.          acyclovir 400 MG tablet Commonly known as:  ZOVIRAX Take 1 tablet (400 mg total) by mouth 3 (three) times daily. Take for 5 days.   ALPRAZolam 0.5 MG tablet Commonly known as:  XANAX Take 1 tablet (0.5 mg total) by mouth at bedtime as needed for anxiety.   b complex vitamins tablet Take 1 tablet by mouth as needed.  BROMELAIN PO Take by mouth as needed.   cyclobenzaprine 10 MG tablet Commonly known as:  FLEXERIL Take 1 tablet (10 mg total) by mouth as needed for muscle spasms.   famotidine 40 MG tablet Commonly known as:  PEPCID Take 40 mg by mouth daily.   metoCLOPramide 10 MG tablet Commonly known as:  REGLAN Take 1 tablet (10 mg total) by mouth 3 (three) times daily as needed.   OVER THE COUNTER MEDICATION   OVER THE COUNTER MEDICATION Digestive Aid   predniSONE 10 MG tablet Commonly known as:  DELTASONE 4 tablets x 2 days, 3 tabs x 2 days, 2 tabs x 2 days, 1 tab x 2 days   PROBIOTIC DAILY PO Take 1 capsule by mouth daily.   rizatriptan 10 MG tablet Commonly known as:  MAXALT Take 1 tablet (10 mg total) by mouth as needed for migraine. May repeat in 2 hours if needed   triamcinolone lotion 0.1 % Commonly known as:  KENALOG Apply 1 application topically 3 (three) times daily.   VITAMIN C PO Take 1,500 mg by mouth daily.   VITAMIN D PO Take 5,000 Units  by mouth 2 (two) times daily.          Objective:   Physical Exam BP 116/68 (BP Location: Left Arm, Patient Position: Sitting, Cuff Size: Small)   Pulse 93   Temp 98.1 F (36.7 C) (Oral)   Resp 14   Ht 5\' 6"  (1.676 m)   Wt 162 lb (73.5 kg)   LMP 12/29/2016 (Approximate)   SpO2 97%   BMI 26.15 kg/m  General:   Well developed, well nourished . NAD.  HEENT:  Normocephalic . Face symmetric, atraumatic  Skin:  Rash noted at chest, abdomen, back.  See picture. Also at neck, face> extremities  Neurologic:  alert & oriented X3.  Speech normal, gait appropriate for age and unassisted Psych--  Cognition and judgment appear intact.  Cooperative with normal attention span and concentration.  Behavior appropriate. No anxious or depressed appearing.        Assessment & Plan:   Allergic reaction to Bactrim: Avoidance! Bactrim is put back on his allergies list.  Will treat with prednisone, Claritin daily.  OTC Zantac daily as well.  See instructions. UTI: Symptoms improved

## 2017-01-19 NOTE — Progress Notes (Signed)
Pre visit review using our clinic review tool, if applicable. No additional management support is needed unless otherwise documented below in the visit note. 

## 2017-01-19 NOTE — Patient Instructions (Addendum)
Avoid Bactrim  Take prednisone as prescribed  Claritin 10 mg (or Benadryl OTC) 1 tablet daily for the next few days  OTC Zantac 75 mg  2 tablets daily until better   Kenalog lotion as needed  Call if not gradually going back to normal.

## 2017-01-25 ENCOUNTER — Encounter: Payer: Self-pay | Admitting: Family Medicine

## 2017-02-26 ENCOUNTER — Encounter: Payer: Self-pay | Admitting: Family Medicine

## 2017-02-26 ENCOUNTER — Ambulatory Visit: Payer: BLUE CROSS/BLUE SHIELD | Admitting: Family Medicine

## 2017-02-26 ENCOUNTER — Ambulatory Visit: Payer: BLUE CROSS/BLUE SHIELD | Admitting: Internal Medicine

## 2017-02-26 VITALS — BP 122/82 | HR 97 | Temp 98.4°F | Ht 66.0 in | Wt 162.4 lb

## 2017-02-26 DIAGNOSIS — J209 Acute bronchitis, unspecified: Secondary | ICD-10-CM | POA: Diagnosis not present

## 2017-02-26 NOTE — Patient Instructions (Signed)
Continue to push fluids, practice good hand hygiene, and cover your mouth if you cough.  If you start having fevers, shaking or shortness of breath, seek immediate care.  I don't think you need an EKG or chest X-ray.   Let us know if you need anything.

## 2017-02-26 NOTE — Progress Notes (Signed)
Pre visit review using our clinic review tool, if applicable. No additional management support is needed unless otherwise documented below in the visit note. 

## 2017-02-26 NOTE — Progress Notes (Signed)
Chief Complaint  Patient presents with  . Cough    Laurita Stotz-Colon here for URI complaints.  Duration: 12 d ago, worsening around 8-9 days ago Associated symptoms: cough, tickle in throat, chest tightness from coughing Denies: sinus pain, rhinorrhea, itchy watery eyes, ear pain, ear drainage, sore throat, wheezing, shortness of breath, myalgia and fevers Treatment to date: Respertonic Sick Contacts? No  ROS:  Const: Denies fevers HEENT: As noted in HPI Lungs: No SOB  Past Medical History:  Diagnosis Date  . Allergy   . Anxiety and depression    Hx of  . Colon polyps 2014   Benign  . Depression    as a child  . Environmental allergies   . GERD (gastroesophageal reflux disease)    not presently  . History of chicken pox   . Migraines   . PTSD (post-traumatic stress disorder)   . Seasonal allergies   . Urine incontinence    Vaginal sling approx 12 yrs ago   BP 122/82 (BP Location: Left Arm, Patient Position: Sitting, Cuff Size: Normal)   Pulse 97   Temp 98.4 F (36.9 C) (Oral)   Ht 5\' 6"  (1.676 m)   Wt 162 lb 6 oz (73.7 kg)   SpO2 98%   BMI 26.21 kg/m  General: Awake, alert, appears stated age HEENT: AT, [place], ears patent b/l and TM's neg, nares patent w/o discharge, pharynx pink and without exudates, MMM Neck: No masses or asymmetry Heart: RRR Lungs: CTAB, no accessory muscle use Psych: Age appropriate judgment and insight, normal mood and affect  Acute bronchitis, unspecified organism  Cont supportive care. I do not think she needs an EKG or CXR given her hx and exam.  F/u prn. If starting to experience fevers, shaking, or shortness of breath, seek immediate care. Pt voiced understanding and agreement to the plan.  [place], DO 02/26/17 3:09 PM d

## 2017-04-02 ENCOUNTER — Ambulatory Visit: Payer: BLUE CROSS/BLUE SHIELD | Admitting: Family Medicine

## 2017-04-02 ENCOUNTER — Encounter: Payer: Self-pay | Admitting: Family Medicine

## 2017-04-02 VITALS — BP 112/68 | HR 82 | Temp 98.5°F | Ht 66.0 in | Wt 164.5 lb

## 2017-04-02 DIAGNOSIS — N3000 Acute cystitis without hematuria: Secondary | ICD-10-CM

## 2017-04-02 LAB — POC URINALSYSI DIPSTICK (AUTOMATED)
BILIRUBIN UA: NEGATIVE
Blood, UA: NEGATIVE
GLUCOSE UA: NEGATIVE
KETONES UA: NEGATIVE
LEUKOCYTES UA: NEGATIVE
Nitrite, UA: NEGATIVE
Protein, UA: NEGATIVE
Urobilinogen, UA: 0.2 E.U./dL
pH, UA: 6 (ref 5.0–8.0)

## 2017-04-02 MED ORDER — CEPHALEXIN 500 MG PO CAPS: 500.0000 mg | ORAL_CAPSULE | Freq: Two times a day (BID) | ORAL | 0 refills | Status: AC

## 2017-04-02 NOTE — Progress Notes (Signed)
Chief Complaint  Patient presents with  . Urinary Tract Infection    urine odor  . Back Pain    abdominal pain    Laura Dougherty is a 47 y.o. female here for possible UTI.  Duration: 5 days. Symptoms: urinary frequency and dysuria Denies: hematuria, urinary hesitancy, urinary retention, fever, nausea and vomiting, vaginal discharge Hx of recurrent UTI? Yes Denies new sexual partners.  ROS:  Constitutional: denies fever GU: As noted in HPI  Past Medical History:  Diagnosis Date  . Allergy   . Anxiety and depression    Hx of  . Colon polyps 2014   Benign  . Depression    as a child  . Environmental allergies   . GERD (gastroesophageal reflux disease)    not presently  . History of chicken pox   . Migraines   . PTSD (post-traumatic stress disorder)   . Seasonal allergies   . Urine incontinence    Vaginal sling approx 12 yrs ago    BP 112/68 (BP Location: Left Arm, Patient Position: Sitting, Cuff Size: Normal)   Pulse 82   Temp 98.5 F (36.9 C) (Oral)   Ht 5\' 6"  (1.676 m)   Wt 164 lb 8 oz (74.6 kg)   SpO2 98%   BMI 26.55 kg/m  General: Awake, alert, appears stated age HEENT: MMM Heart: RRR Lungs: CTAB, normal respiratory effort, no accessory muscle usage Abd: BS+, soft, mild suprapubic ttp, ND, no masses or organomegaly MSK: No CVA tenderness, neg Lloyd's sign Psych: Age appropriate judgment and insight  Acute cystitis without hematuria - Plan: cephALEXin (KEFLEX) 500 MG capsule, POCT Urinalysis Dipstick (Automated)  Orders as above. Keflex if she does not cont to improve.  Hold off on ancillary testing or culture unless she has issues given her high out of pocket cost.  Stay hydrated. Seek immediate care if pt starts to develop fevers, new/worsening symptoms, uncontrollable N/V. F/u prn. The patient voiced understanding and agreement to the plan.  [place], DO 04/02/17 2:44 PM

## 2017-04-02 NOTE — Patient Instructions (Signed)
If your symptoms do not continue to improve, we have called in an antibiotic.  Keep pushing fluids.  Be diligent with your home regimen.   Let us know if you need anything.

## 2017-04-02 NOTE — Progress Notes (Signed)
Pre visit review using our clinic review tool, if applicable. No additional management support is needed unless otherwise documented below in the visit note. 

## 2017-04-03 ENCOUNTER — Encounter: Payer: Self-pay | Admitting: Family Medicine

## 2017-06-26 ENCOUNTER — Other Ambulatory Visit: Payer: Self-pay | Admitting: Family Medicine

## 2017-06-29 MED ORDER — METOCLOPRAMIDE HCL 10 MG PO TABS: 10.0000 mg | ORAL_TABLET | Freq: Three times a day (TID) | ORAL | 0 refills | Status: AC | PRN

## 2017-08-02 ENCOUNTER — Telehealth: Payer: Self-pay | Admitting: Family Medicine

## 2017-08-02 NOTE — Telephone Encounter (Signed)
Normally check CMP, lipid panel, and in women who are having cycles still, will check a CBC. HIV ab if she has not been screened also. TY.

## 2017-08-02 NOTE — Telephone Encounter (Signed)
Copied from [place] [id number]. Topic: Inquiry >> Aug 02, 2017  9:35 AM Ahmed Prima L wrote: Reason for CRM: Patient would like to know which labs would be ordered after her physical on Wednesday so she can call her insurance to see if they will be covered. I advised patient that it may be hard to find that out until she see's Dr Nani Ravens. Patient would like a call back.

## 2017-08-02 NOTE — Telephone Encounter (Signed)
Patient informed. 

## 2017-08-04 ENCOUNTER — Encounter: Payer: Self-pay | Admitting: Family Medicine

## 2017-08-04 ENCOUNTER — Ambulatory Visit (INDEPENDENT_AMBULATORY_CARE_PROVIDER_SITE_OTHER): Payer: BLUE CROSS/BLUE SHIELD | Admitting: Family Medicine

## 2017-08-04 VITALS — BP 112/66 | HR 99 | Temp 99.1°F | Ht 66.0 in | Wt 158.0 lb

## 2017-08-04 DIAGNOSIS — Z0000 Encounter for general adult medical examination without abnormal findings: Secondary | ICD-10-CM | POA: Diagnosis not present

## 2017-08-04 DIAGNOSIS — R300 Dysuria: Secondary | ICD-10-CM | POA: Diagnosis not present

## 2017-08-04 NOTE — Progress Notes (Signed)
Chief Complaint  Patient presents with  . Annual Exam     Well Woman Laura Dougherty is here for a complete physical.   Her last physical was >1 year ago.  Current diet: in general, a "healthy" diet. Current exercise: None. Weight is stable and she denies daytime fatigue. No LMP recorded. Seatbelt? Yes  Health Maintenance Pap/HPV- Yes Mammogram- Yes Tetanus- Yes HIV screening- Yes  Past Medical History:  Diagnosis Date  . Allergy   . Anxiety and depression    Hx of  . Colon polyps 2014   Benign  . Depression    as a child  . Environmental allergies   . GERD (gastroesophageal reflux disease)    not presently  . History of chicken pox   . Migraines   . PTSD (post-traumatic stress disorder)   . Seasonal allergies   . Urine incontinence    Vaginal sling approx 12 yrs ago     Past Surgical History:  Procedure Laterality Date  . CHOLECYSTECTOMY  2008  . COLONOSCOPY    . ENDOMETRIAL ABLATION  ~2011   [facility]  . ESOPHAGOGASTRODUODENOSCOPY ENDOSCOPY    . POLYPECTOMY    . TONSILLECTOMY  20 years ago  . TUBAL LIGATION    . WISDOM TOOTH EXTRACTION      Medications  Current Outpatient Medications on File Prior to Visit  Medication Sig Dispense Refill  . ALPRAZolam (XANAX) 0.5 MG tablet Take 1 tablet (0.5 mg total) by mouth at bedtime as needed for anxiety. 90 tablet 1  . Ascorbic Acid (VITAMIN C PO) Take 1,500 mg by mouth daily.    Marland Kitchen b complex vitamins tablet Take 1 tablet by mouth as needed.    . Bromelains (BROMELAIN PO) Take by mouth as needed.    . Cholecalciferol (VITAMIN D PO) Take 5,000 Units by mouth 2 (two) times daily.     . cyclobenzaprine (FLEXERIL) 10 MG tablet Take 1 tablet (10 mg total) by mouth as needed for muscle spasms. 30 tablet 1  . famotidine (PEPCID) 40 MG tablet Take 40 mg by mouth daily.    . metoCLOPramide (REGLAN) 10 MG tablet Take 1 tablet (10 mg total) by mouth 3 (three) times daily as needed. 90 tablet 0  . OVER THE COUNTER  MEDICATION     . OVER THE COUNTER MEDICATION Digestive Aid    . Probiotic Product (PROBIOTIC DAILY PO) Take 1 capsule by mouth daily.    . rizatriptan (MAXALT) 10 MG tablet Take 1 tablet (10 mg total) by mouth as needed for migraine. May repeat in 2 hours if needed 10 tablet 2  . triamcinolone lotion (KENALOG) 0.1 % Apply 1 application topically 3 (three) times daily. 120 mL 0   Allergies Allergies  Allergen Reactions  . Bactrim [Sulfamethoxazole-Trimethoprim] Other (See Comments)    Urticaria and Hives  . Gluten Meal Nausea And Vomiting  . Kelp [Iodine] Rash  . Levofloxacin Rash    Review of Systems: Constitutional:  no unexpected weight changes Eye:  no recent significant change in vision Ear/Nose/Mouth/Throat:  Ears:  no tinnitus or vertigo and no recent change in hearing Nose/Mouth/Throat:  no complaints of nasal congestion, no sore throat Cardiovascular: no chest pain Respiratory:  no cough and no shortness of breath Gastrointestinal:  no abdominal pain, no change in bowel habits GU:  Female: +dysuria, +pelvic pressure Musculoskeletal/Extremities:  no pain of the joints Integumentary (Skin/Breast):  no abnormal skin lesions reported Neurologic:  no headaches Endocrine:  denies fatigue Hematologic/Lymphatic:  No areas of easy bleeding  Exam BP 112/66 (BP Location: Left Arm, Patient Position: Sitting, Cuff Size: Normal)   Pulse 99   Temp 99.1 F (37.3 C) (Oral)   Ht 5\' 6"  (1.676 m)   Wt 158 lb (71.7 kg)   SpO2 93%   BMI 25.50 kg/m  General:  well developed, well nourished, in no apparent distress Skin:  no significant moles, warts, or growths Head:  no masses, lesions, or tenderness Eyes:  pupils equal and round, sclera anicteric without injection Ears:  canals without lesions, TMs shiny without retraction, no obvious effusion, no erythema Nose:  nares patent, septum midline, mucosa normal, and no drainage or sinus tenderness Throat/Pharynx:  lips and gingiva without  lesion; tongue and uvula midline; non-inflamed pharynx; no exudates or postnasal drainage Neck: neck supple without adenopathy, thyromegaly, or masses Lungs:  clear to auscultation, breath sounds equal bilaterally, no respiratory distress Cardio:  regular rate and rhythm, no bruits, no LE edema Abdomen:  abdomen soft, mild TTP suprapubic; bowel sounds normal; no masses or organomegaly Genital: Defer to GYN Musculoskeletal:  symmetrical muscle groups noted without atrophy or deformity Extremities:  no clubbing, cyanosis, or edema, no deformities, no skin discoloration Neuro:  gait normal; deep tendon reflexes normal and symmetric Psych: well oriented with normal range of affect and appropriate judgment/insight  Assessment and Plan  Well adult exam - Plan: CBC, Comprehensive metabolic panel, Lipid panel  Dysuria - Plan: Urinalysis, Urine Culture   Well 47 y.o. female. Counseled on diet and exercise.  She has been doing well, does need exercise a bit more.  She is coming off of an injury. She does have some nonspecific complaints that could be related to perimenopause.  Her GYN appointment is in 2 weeks and will have a pelvic exam.  May need a topical estrogen. Other orders as above. Follow up in 6 mo or prn. The patient voiced understanding and agreement to the plan.  [place], DO 08/04/17 4:38 PM

## 2017-08-04 NOTE — Progress Notes (Signed)
Pre visit review using our clinic review tool, if applicable. No additional management support is needed unless otherwise documented below in the visit note. 

## 2017-08-04 NOTE — Patient Instructions (Signed)
Keep the diet clean and stay active.  Give us 2-3 business days to get the results of your labs back.   Let us know if you need anything.  

## 2017-08-05 ENCOUNTER — Other Ambulatory Visit (INDEPENDENT_AMBULATORY_CARE_PROVIDER_SITE_OTHER): Payer: BLUE CROSS/BLUE SHIELD

## 2017-08-05 ENCOUNTER — Encounter: Payer: Self-pay | Admitting: Family Medicine

## 2017-08-05 DIAGNOSIS — Z0000 Encounter for general adult medical examination without abnormal findings: Secondary | ICD-10-CM

## 2017-08-05 LAB — LIPID PANEL
CHOLESTEROL: 164 mg/dL (ref 0–200)
HDL: 58.1 mg/dL (ref >39.00)
LDL Cholesterol: 92 mg/dL (ref 0–99)
NONHDL: 105.49
Total CHOL/HDL Ratio: 3
Triglycerides: 68 mg/dL (ref 0.0–149.0)
VLDL: 13.6 mg/dL (ref 0.0–40.0)

## 2017-08-05 LAB — COMPREHENSIVE METABOLIC PANEL
ALBUMIN: 4.2 g/dL (ref 3.5–5.2)
ALK PHOS: 61 U/L (ref 39–117)
ALT: 15 U/L (ref 0–35)
AST: 13 U/L (ref 0–37)
BUN: 16 mg/dL (ref 6–23)
CO2: 26 mEq/L (ref 19–32)
CREATININE: 0.83 mg/dL (ref 0.40–1.20)
Calcium: 9.3 mg/dL (ref 8.4–10.5)
Chloride: 103 mEq/L (ref 96–112)
GFR: 78.44 mL/min (ref >60.00)
GLUCOSE: 105 mg/dL — AB (ref 70–99)
Potassium: 4.4 mEq/L (ref 3.5–5.1)
SODIUM: 136 meq/L (ref 135–145)
TOTAL PROTEIN: 6.8 g/dL (ref 6.0–8.3)
Total Bilirubin: 0.6 mg/dL (ref 0.2–1.2)

## 2017-08-05 LAB — URINALYSIS
BILIRUBIN URINE: NEGATIVE
Hgb urine dipstick: NEGATIVE
Ketones, ur: NEGATIVE
Leukocytes, UA: NEGATIVE
Nitrite: NEGATIVE
PH: 7 (ref 5.0–8.0)
Specific Gravity, Urine: 1.015 (ref 1.000–1.030)
TOTAL PROTEIN, URINE-UPE24: NEGATIVE
Urine Glucose: NEGATIVE
Urobilinogen, UA: 0.2 (ref 0.0–1.0)

## 2017-08-05 LAB — URINE CULTURE
MICRO NUMBER:: 90905492
Result:: NO GROWTH
SPECIMEN QUALITY: ADEQUATE

## 2017-08-05 LAB — CBC
HEMATOCRIT: 40.1 % (ref 36.0–46.0)
Hemoglobin: 13.7 g/dL (ref 12.0–15.0)
MCHC: 34.2 g/dL (ref 30.0–36.0)
MCV: 87.3 fl (ref 78.0–100.0)
Platelets: 347 10*3/uL (ref 150.0–400.0)
RBC: 4.59 Mil/uL (ref 3.87–5.11)
RDW: 13.1 % (ref 11.5–15.5)
WBC: 8.4 10*3/uL (ref 4.0–10.5)

## 2017-08-06 ENCOUNTER — Encounter: Payer: BLUE CROSS/BLUE SHIELD | Admitting: Family Medicine

## 2018-02-04 ENCOUNTER — Ambulatory Visit: Payer: BLUE CROSS/BLUE SHIELD | Admitting: Family Medicine

## 2018-02-14 IMAGING — MR MR HEAD W/O CM
10 series · 48 of 48 positions shown · non-contrast
Comparison: None.

CLINICAL DATA: Short-term memory loss. Geraldo confusion. Occasional
right arm and leg numbness and tingling. Right-sided headaches.

EXAM:
MRI HEAD WITHOUT CONTRAST
TECHNIQUE: Multiplanar, multiecho pulse sequences of the brain and surrounding
structures were obtained without intravenous contrast.

[Series 2: t1_se_sag · sagittal · 5.0mm · 0.45mm/px · 2 of 21 slices shown]
[im 1/21]
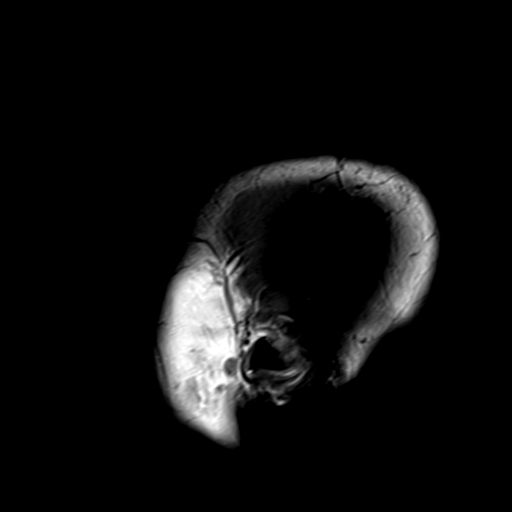
[im 21/21]
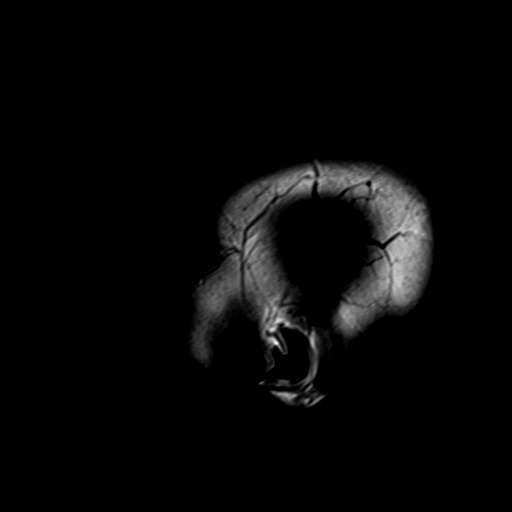

[Series 3: ep2d_diff_(id)_trace · axial · 3.0mm · 1.88mm/px · z∈[-44,+109]mm · 9 of 104 slices shown]
[im 1/104]
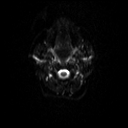
[im 13/104]
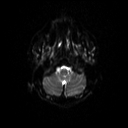
[im 26/104]
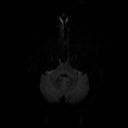
[im 39/104]
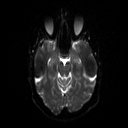
[im 52/104]
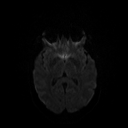
[im 65/104]
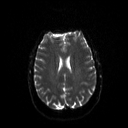
[im 78/104]
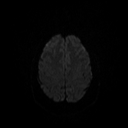
[im 91/104]
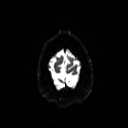
[im 104/104]
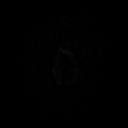

[Series 4: ep2d_diff_(id)_trace_adc · axial · 3.0mm · 1.88mm/px · z∈[-44,+109]mm · 5 of 52 slices shown]
[im 1/52]
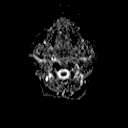
[im 13/52]
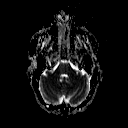
[im 26/52]
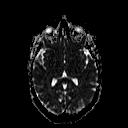
[im 39/52]
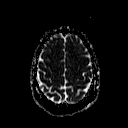
[im 52/52]
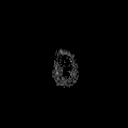

[Series 5: ep2d_diff_cor · coronal · 5.0mm · 1.77mm/px · 5 of 52 slices shown]
[im 1/52]
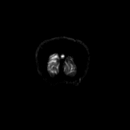
[im 13/52]
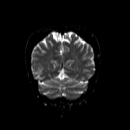
[im 26/52]
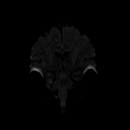
[im 39/52]
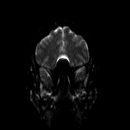
[im 52/52]
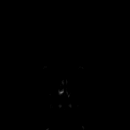

[Series 6: ep2d_diff_cor_adc · coronal · 5.0mm · 1.77mm/px · 2 of 26 slices shown]
[im 1/26]
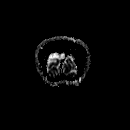
[im 26/26]
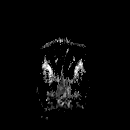

[Series 8: swi_images · axial · 2.0mm · 0.94mm/px · z∈[-40,+101]mm · 6 of 72 slices shown]
[im 1/72]
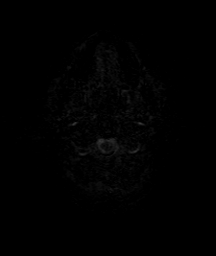
[im 15/72]
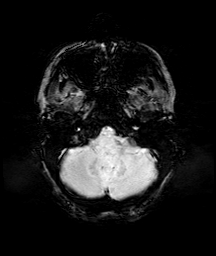
[im 29/72]
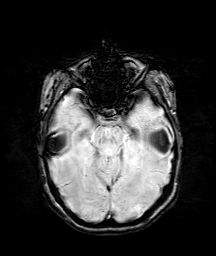
[im 43/72]
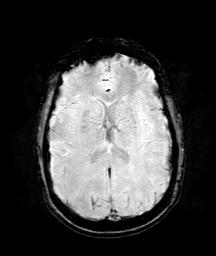
[im 57/72]
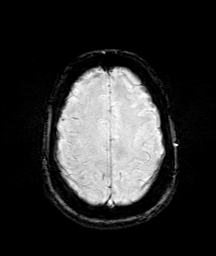
[im 72/72]
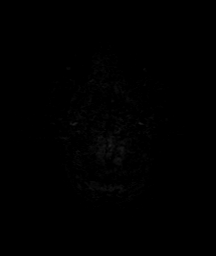

[Series 9: FLAIR · axial · 3.0mm · 0.47mm/px · z∈[-42,+102]mm · 2 of 25 slices shown]
[im 1/25]
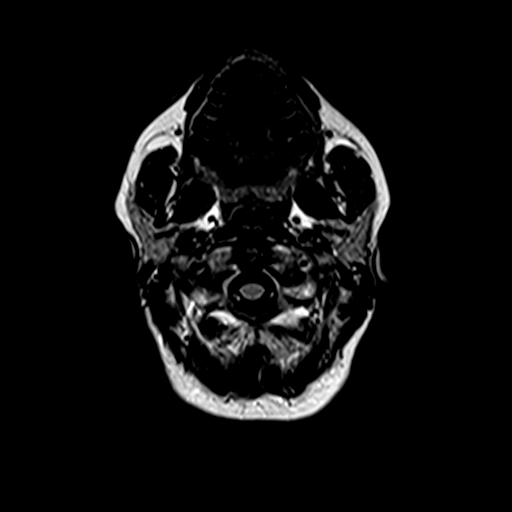
[im 25/25]
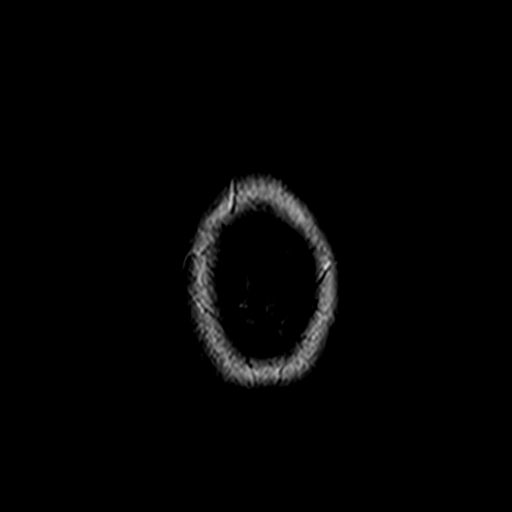

[Series 10: t2_tse_tra_512 · axial · 5.0mm · 0.62mm/px · z∈[-41,+102]mm · 2 of 25 slices shown]
[im 1/25]
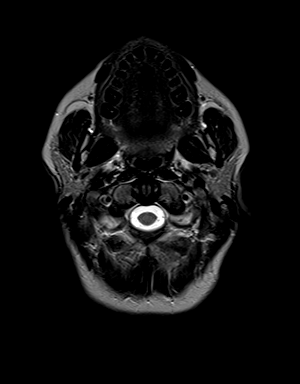
[im 25/25]
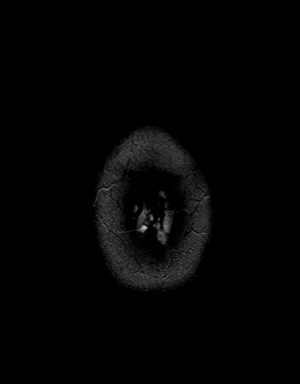

[Series 11: t1_mpr_tra · axial · 1.0mm · 0.75mm/px · z∈[-40,+102]mm · 13 of 144 slices shown]
[im 1/144]
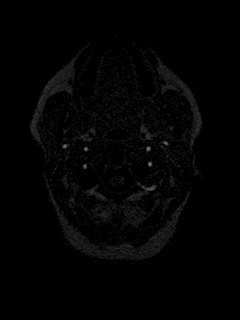
[im 12/144]
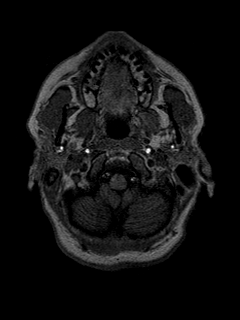
[im 24/144]
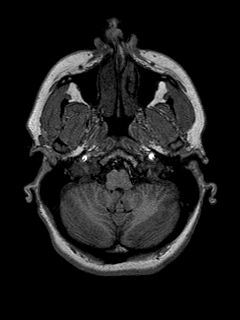
[im 36/144]
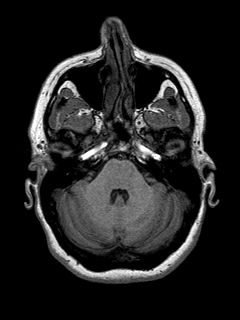
[im 48/144]
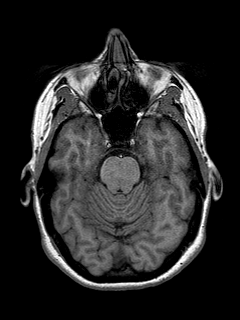
[im 60/144]
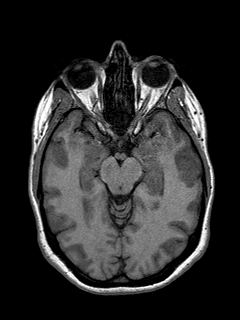
[im 72/144]
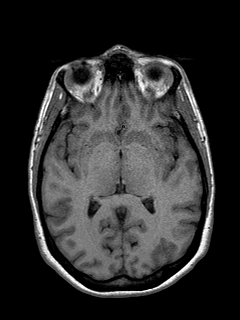
[im 84/144]
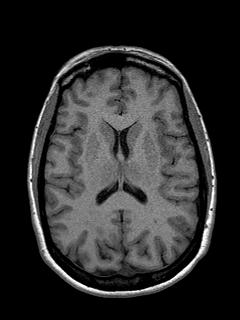
[im 96/144]
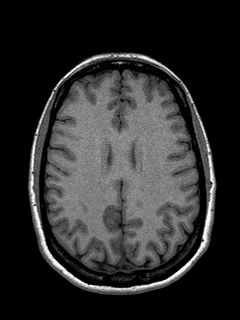
[im 108/144]
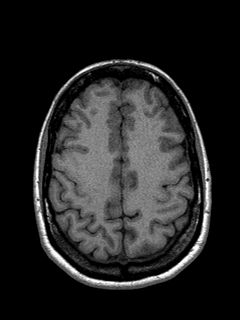
[im 120/144]
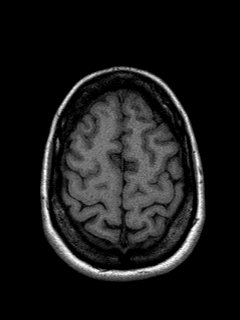
[im 132/144]
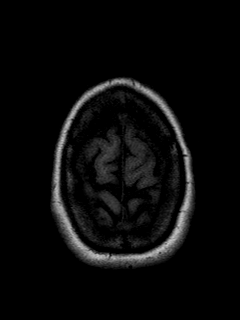
[im 144/144]
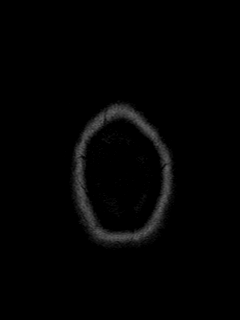

[Series 12: T2 · coronal · 5.0mm · 0.45mm/px · 2 of 28 slices shown]
[im 1/28]
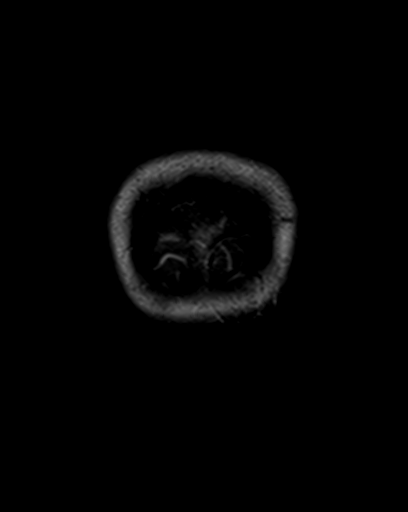
[im 28/28]
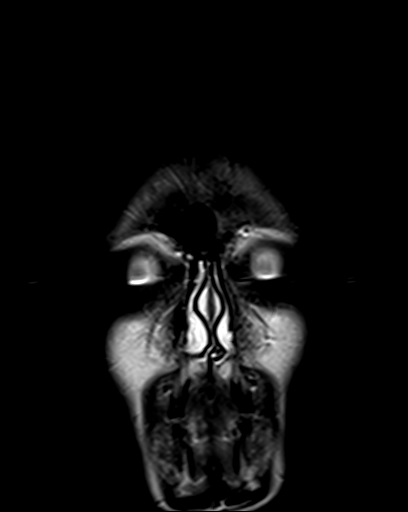

[48 of 48 positions shown; findings below may reference images not displayed]

FINDINGS: Brain: There is no evidence of acute infarct, intracranial
hemorrhage, mass, midline shift, or extra-axial fluid collection.
The ventricles and sulci are normal. The brain is normal in signal.

Vascular: Major intracranial vascular flow voids are preserved.

Skull and upper cervical spine: Unremarkable bone marrow signal.

Sinuses/Orbits: Unremarkable orbits. Paranasal sinuses and mastoid
air cells are clear.

Other: None.
IMPRESSION: Unremarkable brain MRI.

## 2018-05-18 ENCOUNTER — Encounter: Payer: Self-pay | Admitting: Family Medicine

## 2018-05-20 MED ORDER — RIZATRIPTAN BENZOATE 10 MG PO TABS: 10.0000 mg | ORAL_TABLET | ORAL | 2 refills | Status: AC | PRN

## 2020-03-15 ENCOUNTER — Encounter: Payer: Self-pay | Admitting: Gastroenterology
